# Patient Record
Sex: Male | Born: 1975
Health system: Southern US, Community
[De-identification: ages and names within clinical notes are randomized; demographics above are authoritative.]

## PROBLEM LIST (undated history)

## (undated) DIAGNOSIS — M1990 Unspecified osteoarthritis, unspecified site: Secondary | ICD-10-CM

## (undated) DIAGNOSIS — L559 Sunburn, unspecified: Secondary | ICD-10-CM

## (undated) DIAGNOSIS — F410 Panic disorder [episodic paroxysmal anxiety] without agoraphobia: Secondary | ICD-10-CM

## (undated) DIAGNOSIS — Z7289 Other problems related to lifestyle: Secondary | ICD-10-CM

## (undated) DIAGNOSIS — I10 Essential (primary) hypertension: Secondary | ICD-10-CM

## (undated) DIAGNOSIS — Z720 Tobacco use: Secondary | ICD-10-CM

## (undated) DIAGNOSIS — F1090 Alcohol use, unspecified, uncomplicated: Secondary | ICD-10-CM

## (undated) DIAGNOSIS — R519 Headache, unspecified: Secondary | ICD-10-CM

## (undated) DIAGNOSIS — M87051 Idiopathic aseptic necrosis of right femur: Secondary | ICD-10-CM

## (undated) DIAGNOSIS — R51 Headache: Secondary | ICD-10-CM

## (undated) DIAGNOSIS — K219 Gastro-esophageal reflux disease without esophagitis: Secondary | ICD-10-CM

## (undated) DIAGNOSIS — J449 Chronic obstructive pulmonary disease, unspecified: Secondary | ICD-10-CM

## (undated) HISTORY — PX: TONSILLECTOMY: SUR1361

## (undated) HISTORY — DX: Other problems related to lifestyle: Z72.89

## (undated) HISTORY — DX: Tobacco use: Z72.0

## (undated) HISTORY — DX: Alcohol use, unspecified, uncomplicated: F10.90

## (undated) HISTORY — PX: OTHER SURGICAL HISTORY: SHX169

---

## 2015-12-03 ENCOUNTER — Other Ambulatory Visit (HOSPITAL_COMMUNITY): Payer: Self-pay | Admitting: *Deleted

## 2015-12-03 NOTE — Patient Instructions (Addendum)
Gena FrayJody D Schellhase  12/03/2015   Your procedure is scheduled on: 12-17-15  [facility]  [facility] to 3rd floor to  Short Stay Center at 515  AM.  Call this number if you have problems the morning of surgery [phone]   Remember: ONLY 1 PERSON MAY GO WITH YOU TO SHORT STAY TO GET  READY MORNING OF YOUR SURGERY.  Do not eat food or drink liquids :After Midnight.     Take these medicines the morning of surgery with A SIP OF WATER: NONE                               You may not have any metal on your body including hair pins and              piercings  Do not wear jewelry, make-up, lotions, powders or perfumes, deodorant             Do not wear nail polish.  Do not shave  48 hours prior to surgery.              Men may shave face and neck.   Do not bring valuables to the hospital. [place] IS NOT             RESPONSIBLE   FOR VALUABLES.  Contacts, dentures or bridgework may not be worn into surgery.  Leave suitcase in the car. After surgery it may be brought to your room.   WIFE April DRIVER CELL [phone]               Please read over the following fact sheets you were given: _____________________________________________________________________             [facility] - Preparing for [facility] Before surgery, you can play an important role.  Because skin is not sterile, your skin needs to be as free of germs as possible.  You can reduce the number of germs on your skin by washing with CHG (chlorahexidine gluconate) soap before surgery.  CHG is an antiseptic cleaner which kills germs and bonds with the skin to continue killing germs even after washing. Please DO NOT use if you have an allergy to CHG or antibacterial soaps.  If your skin becomes reddened/irritated stop using the CHG and inform your nurse when you arrive at Short Stay. Do not shave (including legs and underarms) for at least 48 hours prior to the first CHG  shower.  You may shave your face/neck. Please follow these instructions carefully:  1.  Shower with CHG Soap the night before surgery and the  morning of Surgery.  2.  If you choose to wash your hair, wash your hair first as usual with your  normal  shampoo.  3.  After you shampoo, rinse your hair and body thoroughly to remove the  shampoo.                           4.  Use CHG as you would any other liquid soap.  You can apply chg directly  to the skin and wash                       Gently with a scrungie or clean washcloth.  5.  Apply the CHG Soap to your body ONLY FROM THE NECK DOWN.   Do not use on face/ open                           Wound or open sores. Avoid contact with eyes, ears mouth and genitals (private parts).                       Wash face,  Genitals (private parts) with your normal soap.             6.  Wash thoroughly, paying special attention to the area where your surgery  will be performed.  7.  Thoroughly rinse your body with warm water from the neck down.  8.  DO NOT shower/wash with your normal soap after using and rinsing off  the CHG Soap.                9.  Pat yourself dry with a clean towel.            10.  Wear clean pajamas.            11.  Place clean sheets on your bed the night of your first shower and do not  sleep with pets. Day of Surgery : Do not apply any lotions/deodorants the morning of surgery.  Please wear clean clothes to the hospital/surgery center.  FAILURE TO FOLLOW THESE INSTRUCTIONS MAY RESULT IN THE CANCELLATION OF YOUR SURGERY PATIENT SIGNATURE_________________________________  NURSE SIGNATURE__________________________________  ________________________________________________________________________   Adam Phenix  An incentive spirometer is a tool that can help keep your lungs clear and active. This tool measures how well you are filling your lungs with each breath. Taking long deep breaths may help reverse or decrease the chance  of developing breathing (pulmonary) problems (especially infection) following:  A long period of time when you are unable to move or be active. BEFORE THE PROCEDURE   If the spirometer includes an indicator to show your best effort, your nurse or respiratory therapist will set it to a desired goal.  If possible, sit up straight or lean slightly forward. Try not to slouch.  Hold the incentive spirometer in an upright position. INSTRUCTIONS FOR USE   Sit on the edge of your bed if possible, or sit up as far as you can in bed or on a chair.  Hold the incentive spirometer in an upright position.  Breathe out normally.  Place the mouthpiece in your mouth and seal your lips tightly around it.  Breathe in slowly and as deeply as possible, raising the piston or the ball toward the top of the column.  Hold your breath for 3-5 seconds or for as long as possible. Allow the piston or ball to fall to the bottom of the column.  Remove the mouthpiece from your mouth and breathe out normally.  Rest for a few seconds and repeat Steps 1 through 7 at least 10 times every 1-2 hours when you are awake. Take your time and take a few normal breaths between deep breaths.  The spirometer may include an indicator to show your best effort. Use the indicator as a goal to work toward during each repetition.  After each set of 10 deep breaths, practice coughing to be sure your lungs are clear. If you have an incision (the cut made at the time of surgery), support your incision when coughing by placing a pillow or  rolled up towels firmly against it. Once you are able to get out of bed, walk around indoors and cough well. You may stop using the incentive spirometer when instructed by your caregiver.  RISKS AND COMPLICATIONS  Take your time so you do not get dizzy or light-headed.  If you are in pain, you may need to take or ask for pain medication before doing incentive spirometry. It is harder to take a deep  breath if you are having pain. AFTER USE  Rest and breathe slowly and easily.  It can be helpful to keep track of a log of your progress. Your caregiver can provide you with a simple table to help with this. If you are using the spirometer at home, follow these instructions: [place] IF:   You are having difficultly using the spirometer.  You have trouble using the spirometer as often as instructed.  Your pain medication is not giving enough relief while using the spirometer.  You develop fever of 100.5 F (38.1 C) or higher. SEEK IMMEDIATE MEDICAL CARE IF:   You cough up bloody sputum that had not been present before.  You develop fever of 102 F (38.9 C) or greater.  You develop worsening pain at or near the incision site. MAKE SURE YOU:   Understand these instructions.  Will watch your condition.  Will get help right away if you are not doing well or get worse. Document Released: 12/28/2006 Document Revised: 11/09/2011 Document Reviewed: 02/28/2007 Exit[employer] Patient Information 2014 Exit[employer], [place].   ________________________________________________________________________  WHAT IS A BLOOD TRANSFUSION? Blood Transfusion Information  A transfusion is the replacement of blood or some of its parts. Blood is made up of multiple cells which provide different functions.  Red blood cells carry oxygen and are used for blood loss replacement.  White blood cells fight against infection.  Platelets control bleeding.  Plasma helps clot blood.  Other blood products are available for specialized needs, such as hemophilia or other clotting disorders. BEFORE THE TRANSFUSION  Who gives blood for transfusions?   Healthy volunteers who are fully evaluated to make sure their blood is safe. This is blood bank blood. Transfusion therapy is the safest it has ever been in the practice of medicine. Before blood is taken from a donor, a complete history is taken to make sure  that person has no history of diseases nor engages in risky social behavior (examples are intravenous drug use or sexual activity with multiple partners). The donor's travel history is screened to minimize risk of transmitting infections, such as malaria. The donated blood is tested for signs of infectious diseases, such as HIV and hepatitis. The blood is then tested to be sure it is compatible with you in order to minimize the chance of a transfusion reaction. If you or a relative donates blood, this is often done in anticipation of surgery and is not appropriate for emergency situations. It takes many days to process the donated blood. RISKS AND COMPLICATIONS Although transfusion therapy is very safe and saves many lives, the main dangers of transfusion include:   Getting an infectious disease.  Developing a transfusion reaction. This is an allergic reaction to something in the blood you were given. Every precaution is taken to prevent this. The decision to have a blood transfusion has been considered carefully by your caregiver before blood is given. Blood is not given unless the benefits outweigh the risks. AFTER THE TRANSFUSION  Right after receiving a blood transfusion, you will usually  feel much better and more energetic. This is especially true if your red blood cells have gotten low (anemic). The transfusion raises the level of the red blood cells which carry oxygen, and this usually causes an energy increase.  The nurse administering the transfusion will monitor you carefully for complications. HOME CARE INSTRUCTIONS  No special instructions are needed after a transfusion. You may find your energy is better. Speak with your caregiver about any limitations on activity for underlying diseases you may have. SEEK MEDICAL CARE IF:   Your condition is not improving after your transfusion.  You develop redness or irritation at the intravenous (IV) site. SEEK IMMEDIATE MEDICAL CARE IF:  Any of  the following symptoms occur over the next 12 hours:  Shaking chills.  You have a temperature by mouth above 102 F (38.9 C), not controlled by medicine.  Chest, back, or muscle pain.  People around you feel you are not acting correctly or are confused.  Shortness of breath or difficulty breathing.  Dizziness and fainting.  You get a rash or develop hives.  You have a decrease in urine output.  Your urine turns a dark color or changes to pink, red, or brown. Any of the following symptoms occur over the next 10 days:  You have a temperature by mouth above 102 F (38.9 C), not controlled by medicine.  Shortness of breath.  Weakness after normal activity.  The white part of the eye turns yellow (jaundice).  You have a decrease in the amount of urine or are urinating less often.  Your urine turns a dark color or changes to pink, red, or brown. Document Released: 08/14/2000 Document Revised: 11/09/2011 Document Reviewed: 04/02/2008 [facility] Patient Information 2014 [place], [place].  _______________________________________________________________________

## 2015-12-05 ENCOUNTER — Encounter (HOSPITAL_COMMUNITY)
Admission: RE | Admit: 2015-12-05 | Discharge: 2015-12-05 | Disposition: A | Discharge status: Home / Self Care | Payer: Medicaid Other | Source: Ambulatory Visit | Attending: Orthopedic Surgery | Admitting: Orthopedic Surgery

## 2015-12-05 ENCOUNTER — Encounter (HOSPITAL_COMMUNITY): Payer: Self-pay

## 2015-12-05 DIAGNOSIS — Z01818 Encounter for other preprocedural examination: Secondary | ICD-10-CM | POA: Diagnosis not present

## 2015-12-05 DIAGNOSIS — Z01812 Encounter for preprocedural laboratory examination: Secondary | ICD-10-CM | POA: Insufficient documentation

## 2015-12-05 DIAGNOSIS — M879 Osteonecrosis, unspecified: Secondary | ICD-10-CM | POA: Insufficient documentation

## 2015-12-05 HISTORY — DX: Chronic obstructive pulmonary disease, unspecified: J44.9

## 2015-12-05 HISTORY — DX: Gastro-esophageal reflux disease without esophagitis: K21.9

## 2015-12-05 HISTORY — DX: Essential (primary) hypertension: I10

## 2015-12-05 HISTORY — DX: Panic disorder (episodic paroxysmal anxiety): F41.0

## 2015-12-05 HISTORY — DX: Idiopathic aseptic necrosis of right femur: M87.051

## 2015-12-05 LAB — COMPREHENSIVE METABOLIC PANEL
ALT: 54 U/L (ref 17–63)
ANION GAP: 12 (ref 5–15)
AST: 98 U/L — ABNORMAL HIGH (ref 15–41)
Albumin: 4.1 g/dL (ref 3.5–5.0)
Alkaline Phosphatase: 151 U/L — ABNORMAL HIGH (ref 38–126)
BILIRUBIN TOTAL: 1.2 mg/dL (ref 0.3–1.2)
BUN: 9 mg/dL (ref 6–20)
CO2: 27 mmol/L (ref 22–32)
Calcium: 9.7 mg/dL (ref 8.9–10.3)
Chloride: 100 mmol/L — ABNORMAL LOW (ref 101–111)
Creatinine, Ser: 0.79 mg/dL (ref 0.61–1.24)
Glucose, Bld: 96 mg/dL (ref 65–99)
POTASSIUM: 4.4 mmol/L (ref 3.5–5.1)
Sodium: 139 mmol/L (ref 135–145)
TOTAL PROTEIN: 7.9 g/dL (ref 6.5–8.1)

## 2015-12-05 LAB — URINE MICROSCOPIC-ADD ON

## 2015-12-05 LAB — CBC
HEMATOCRIT: 43.9 % (ref 39.0–52.0)
Hemoglobin: 16 g/dL (ref 13.0–17.0)
MCH: 37.6 pg — AB (ref 26.0–34.0)
MCHC: 36.4 g/dL — AB (ref 30.0–36.0)
MCV: 103.3 fL — AB (ref 78.0–100.0)
PLATELETS: 244 10*3/uL (ref 150–400)
RBC: 4.25 MIL/uL (ref 4.22–5.81)
RDW: 13.7 % (ref 11.5–15.5)
WBC: 8.9 10*3/uL (ref 4.0–10.5)

## 2015-12-05 LAB — URINALYSIS, ROUTINE W REFLEX MICROSCOPIC
Glucose, UA: NEGATIVE mg/dL
HGB URINE DIPSTICK: NEGATIVE
Ketones, ur: 15 mg/dL — AB
NITRITE: NEGATIVE
PROTEIN: NEGATIVE mg/dL
Specific Gravity, Urine: 1.024 (ref 1.005–1.030)
pH: 6 (ref 5.0–8.0)

## 2015-12-05 LAB — APTT: aPTT: 35 seconds (ref 24–37)

## 2015-12-05 LAB — PROTIME-INR
INR: 1.07 (ref 0.00–1.49)
Prothrombin Time: 13.7 seconds (ref 11.6–15.2)

## 2015-12-05 LAB — SURGICAL PCR SCREEN
MRSA, PCR: NEGATIVE
STAPHYLOCOCCUS AUREUS: NEGATIVE

## 2015-12-05 NOTE — Progress Notes (Signed)
Pt had abnormal ekg from [facility] 08-07-15, stress test [facility] 08-09-15 on chart normal repeated ekg with pre op today. Chest xray 08-07-15 epic ekg 07-02-15 [facility] on chart

## 2015-12-15 NOTE — H&P (Addendum)
TOTAL HIP ADMISSION H&P  Patient is admitted for left total hip arthroplasty, anterior approach.  Subjective:  Chief Complaint: Light hip AVN (secondary to alcoholism) / pain  HPI: Timothy Foley, 40 y.o. male, has Foley history of pain and functional disability in the bilaterally hip(s) due to arthritis and AVN and patient has failed non-surgical conservative treatments for greater than 12 weeks to include NSAID's and/or analgesics, corticosteriod injections, use of assistive devices and activity modification.  Onset of symptoms was abrupt starting ~1 years ago with rapidlly worsening course since that time.The patient noted no past surgery on the left hip(s).  Patient currently rates pain in the left hip at 10 out of 10 with activity. Patient has night pain, worsening of pain with activity and weight bearing, trendelenberg gait, pain that interfers with activities of daily living and pain with passive range of motion. Patient has evidence of periarticular osteophytes, joint space narrowing and AVN by imaging studies. This condition presents safety issues increasing the risk of falls.  There is no current active infection.   Risks, benefits and expectations were discussed with the patient.  Risks including but not limited to the risk of anesthesia, blood clots, nerve damage, blood vessel damage, failure of the prosthesis, infection and up to and including death.  Patient understand the risks, benefits and expectations and wishes to proceed with surgery.   PCP: Timothy Foley,Timothy A, MD  D/C Plans:      Home  Post-op Meds:       No Rx given  Tranexamic Acid:      To be given - IV  Decadron:      Is to be given  FYI:     ASA  Norco  Nicotine Patch 14 mg  Spiritus Fermenti    Past Medical History  Diagnosis Date  . Hypertension   . Panic attacks     OCCASIONAL  . COPD (chronic obstructive pulmonary disease) (HCC)   . GERD (gastroesophageal reflux disease)   . Avascular necrosis of bone of  right hip [facility](HCC)     Past Surgical History  Procedure Laterality Date  . Pins to left wrist    . Tonsillectomy      No prescriptions prior to admission   Allergies  Allergen Reactions  . Erythromycin     "CHILDHOOD REACTION "    Social History  Substance Use Topics  . Smoking status: Current Every Day Smoker -- 1.00 packs/day for 25 years    Types: Cigarettes  . Smokeless tobacco: Never Used  . Alcohol Use: Yes     Comment: 1 FIFITH OF LIQUOR PER DAY       Review of Systems  Constitutional: Negative.   HENT: Negative.   Eyes: Negative.   Respiratory: Negative.   Cardiovascular: Negative.   Gastrointestinal: Positive for heartburn.  Genitourinary: Negative.   Musculoskeletal: Positive for joint pain.  Skin: Negative.   Neurological: Negative.   Endo/Heme/Allergies: Negative.   Psychiatric/Behavioral: The patient is nervous/anxious.     Objective:  Physical Exam  Constitutional: He is oriented to person, place, and time. He appears well-developed.  HENT:  Head: Normocephalic.  Eyes: Pupils are equal, round, and reactive to light.  Neck: Neck supple. No JVD present. No tracheal deviation present. No thyromegaly present.  Cardiovascular: Normal rate, regular rhythm, normal heart sounds and intact distal pulses.   Respiratory: Effort normal and breath sounds normal. No stridor. No respiratory distress. He has no wheezes.  GI: Soft. There is no tenderness.  There is no guarding.  Musculoskeletal:       Left hip: He exhibits decreased range of motion, decreased strength, tenderness and bony tenderness. He exhibits no swelling, no deformity and no laceration.  Lymphadenopathy:    He has no cervical adenopathy.  Neurological: He is alert and oriented to person, place, and time. Foley sensory deficit (bilateral LEs) is present.  Skin: Skin is warm and dry.  Psychiatric: He has Foley normal mood and affect.      Imaging Review Plain radiographs demonstrate severe degenerative  joint disease / AVN of the left hip(s). The bone quality appears to be good for age and reported activity level.  Assessment/Plan:  AVN, left hip  The patient history, physical examination, clinical judgement of the provider and imaging studies are consistent with AVN of the left hip(s) and total hip arthroplasty is deemed medically necessary. The treatment options including medical management, injection therapy, arthroscopy and arthroplasty were discussed at length. The risks and benefits of total hip arthroplasty were presented and reviewed. The risks due to aseptic loosening, infection, stiffness, dislocation/subluxation,  thromboembolic complications and other imponderables were discussed.  The patient acknowledged the explanation, agreed to proceed with the plan and consent was signed. Patient is being admitted for inpatient treatment for surgery, pain control, PT, OT, prophylactic antibiotics, VTE prophylaxis, progressive ambulation and ADL's and discharge planning.The patient is planning to be discharged home.     Timothy Auerbach Breezy Hertenstein   PA-C  12/15/2015, 1:06 PM

## 2015-12-17 ENCOUNTER — Ambulatory Visit (HOSPITAL_COMMUNITY): Payer: Medicaid Other | Admitting: Certified Registered"

## 2015-12-17 ENCOUNTER — Ambulatory Visit (HOSPITAL_COMMUNITY): Payer: Medicaid Other

## 2015-12-17 ENCOUNTER — Ambulatory Visit (HOSPITAL_COMMUNITY)
Admission: RE | Admit: 2015-12-17 | Discharge: 2015-12-17 | Disposition: A | Discharge status: Home / Self Care | Payer: Medicaid Other | Source: Ambulatory Visit | Attending: Orthopedic Surgery | Admitting: Orthopedic Surgery

## 2015-12-17 ENCOUNTER — Encounter (HOSPITAL_COMMUNITY)
Admission: RE | Disposition: A | Discharge status: Home / Self Care | Payer: Self-pay | Source: Ambulatory Visit | Attending: Orthopedic Surgery

## 2015-12-17 ENCOUNTER — Encounter (HOSPITAL_COMMUNITY): Payer: Self-pay | Admitting: *Deleted

## 2015-12-17 DIAGNOSIS — M160 Bilateral primary osteoarthritis of hip: Secondary | ICD-10-CM | POA: Insufficient documentation

## 2015-12-17 DIAGNOSIS — I10 Essential (primary) hypertension: Secondary | ICD-10-CM | POA: Diagnosis not present

## 2015-12-17 DIAGNOSIS — F17210 Nicotine dependence, cigarettes, uncomplicated: Secondary | ICD-10-CM | POA: Insufficient documentation

## 2015-12-17 DIAGNOSIS — K219 Gastro-esophageal reflux disease without esophagitis: Secondary | ICD-10-CM | POA: Insufficient documentation

## 2015-12-17 DIAGNOSIS — M879 Osteonecrosis, unspecified: Secondary | ICD-10-CM | POA: Diagnosis not present

## 2015-12-17 DIAGNOSIS — J449 Chronic obstructive pulmonary disease, unspecified: Secondary | ICD-10-CM | POA: Diagnosis not present

## 2015-12-17 DIAGNOSIS — Z96649 Presence of unspecified artificial hip joint: Secondary | ICD-10-CM

## 2015-12-17 HISTORY — PX: TOTAL HIP ARTHROPLASTY: SHX124

## 2015-12-17 LAB — TYPE AND SCREEN
ABO/RH(D): A POS
ANTIBODY SCREEN: NEGATIVE

## 2015-12-17 LAB — ABO/RH: ABO/RH(D): A POS

## 2015-12-17 SURGERY — ARTHROPLASTY, HIP, TOTAL, ANTERIOR APPROACH
Anesthesia: Spinal | Site: Hip | Laterality: Left

## 2015-12-17 MED ORDER — LIDOCAINE HCL (CARDIAC) 20 MG/ML IV SOLN
INTRAVENOUS | Status: DC | PRN
  Administered 2015-12-17: 80 mg via INTRAVENOUS

## 2015-12-17 MED ORDER — METHOCARBAMOL 1000 MG/10ML IJ SOLN
500.0000 mg | Freq: Four times a day (QID) | INTRAVENOUS | Status: DC | PRN
  Administered 2015-12-17: 500 mg via INTRAVENOUS
  Filled 2015-12-17 (×2): qty 5

## 2015-12-17 MED ORDER — LACTATED RINGERS IV SOLN
INTRAVENOUS | Status: DC | PRN
  Administered 2015-12-17 (×3): via INTRAVENOUS

## 2015-12-17 MED ORDER — PHENYLEPHRINE 40 MCG/ML (10ML) SYRINGE FOR IV PUSH (FOR BLOOD PRESSURE SUPPORT)
PREFILLED_SYRINGE | INTRAVENOUS | Status: AC
  Filled 2015-12-17: qty 10

## 2015-12-17 MED ORDER — PROPOFOL 10 MG/ML IV BOLUS
INTRAVENOUS | Status: AC
  Filled 2015-12-17: qty 40

## 2015-12-17 MED ORDER — MIDAZOLAM HCL 5 MG/5ML IJ SOLN
INTRAMUSCULAR | Status: DC | PRN
  Administered 2015-12-17 (×2): 2 mg via INTRAVENOUS

## 2015-12-17 MED ORDER — OXYCODONE HCL 5 MG PO TABS: 5.0000 mg | ORAL_TABLET | ORAL | Status: DC | PRN

## 2015-12-17 MED ORDER — AMLODIPINE BESYLATE 5 MG PO TABS
5.0000 mg | ORAL_TABLET | Freq: Once | ORAL | Status: AC
  Administered 2015-12-17: 5 mg via ORAL
  Filled 2015-12-17: qty 1

## 2015-12-17 MED ORDER — ASPIRIN EC 325 MG PO TBEC: 325.0000 mg | DELAYED_RELEASE_TABLET | Freq: Two times a day (BID) | ORAL | Status: AC

## 2015-12-17 MED ORDER — CEFAZOLIN SODIUM-DEXTROSE 2-4 GM/100ML-% IV SOLN
INTRAVENOUS | Status: AC
  Filled 2015-12-17: qty 100

## 2015-12-17 MED ORDER — MIDAZOLAM HCL 2 MG/2ML IJ SOLN
INTRAMUSCULAR | Status: AC
  Filled 2015-12-17: qty 2

## 2015-12-17 MED ORDER — DEXAMETHASONE SODIUM PHOSPHATE 10 MG/ML IJ SOLN
INTRAMUSCULAR | Status: AC
  Filled 2015-12-17: qty 1

## 2015-12-17 MED ORDER — PROCHLORPERAZINE EDISYLATE 5 MG/ML IJ SOLN
10.0000 mg | Freq: Once | INTRAMUSCULAR | Status: DC
  Filled 2015-12-17: qty 2

## 2015-12-17 MED ORDER — ONDANSETRON HCL 4 MG/2ML IJ SOLN
INTRAMUSCULAR | Status: AC
  Filled 2015-12-17: qty 2

## 2015-12-17 MED ORDER — METHOCARBAMOL 500 MG PO TABS: 500.0000 mg | ORAL_TABLET | Freq: Four times a day (QID) | ORAL | Status: DC | PRN

## 2015-12-17 MED ORDER — PROPOFOL 10 MG/ML IV BOLUS
INTRAVENOUS | Status: AC
Start: 2015-12-17 — End: 2015-12-17
  Filled 2015-12-17: qty 40

## 2015-12-17 MED ORDER — HYDROMORPHONE HCL 1 MG/ML IJ SOLN
0.5000 mg | INTRAMUSCULAR | Status: DC | PRN
  Administered 2015-12-17: 1 mg via INTRAVENOUS
  Filled 2015-12-17: qty 1

## 2015-12-17 MED ORDER — TRANEXAMIC ACID 1000 MG/10ML IV SOLN
1000.0000 mg | Freq: Once | INTRAVENOUS | Status: AC
  Administered 2015-12-17: 1000 mg via INTRAVENOUS
  Filled 2015-12-17: qty 10

## 2015-12-17 MED ORDER — BUPIVACAINE IN DEXTROSE 0.75-8.25 % IT SOLN
INTRATHECAL | Status: DC | PRN
  Administered 2015-12-17: 2 mL via INTRATHECAL

## 2015-12-17 MED ORDER — ONDANSETRON HCL 4 MG/2ML IJ SOLN
INTRAMUSCULAR | Status: DC | PRN
  Administered 2015-12-17 (×2): 2 mg via INTRAVENOUS

## 2015-12-17 MED ORDER — PROPOFOL 500 MG/50ML IV EMUL
INTRAVENOUS | Status: DC | PRN
  Administered 2015-12-17: 200 ug/kg/min via INTRAVENOUS

## 2015-12-17 MED ORDER — SODIUM CHLORIDE 0.9 % IR SOLN
Status: DC | PRN
  Administered 2015-12-17: 1000 mL

## 2015-12-17 MED ORDER — HYDROMORPHONE HCL 1 MG/ML IJ SOLN: 0.2500 mg | INTRAMUSCULAR | Status: DC | PRN

## 2015-12-17 MED ORDER — FENTANYL CITRATE (PF) 100 MCG/2ML IJ SOLN
INTRAMUSCULAR | Status: AC
  Filled 2015-12-17: qty 2

## 2015-12-17 MED ORDER — PROPOFOL 10 MG/ML IV BOLUS
INTRAVENOUS | Status: AC
  Filled 2015-12-17: qty 60

## 2015-12-17 MED ORDER — LIDOCAINE HCL (CARDIAC) 20 MG/ML IV SOLN
INTRAVENOUS | Status: AC
  Filled 2015-12-17: qty 5

## 2015-12-17 MED ORDER — OXYCODONE HCL 5 MG PO TABS
5.0000 mg | ORAL_TABLET | ORAL | Status: DC
  Administered 2015-12-17: 15 mg via ORAL
  Administered 2015-12-17: 10 mg via ORAL
  Administered 2015-12-17: 5 mg via ORAL
  Filled 2015-12-17: qty 3
  Filled 2015-12-17: qty 2
  Filled 2015-12-17: qty 1

## 2015-12-17 MED ORDER — DEXAMETHASONE SODIUM PHOSPHATE 10 MG/ML IJ SOLN
10.0000 mg | Freq: Once | INTRAMUSCULAR | Status: AC
  Administered 2015-12-17: 10 mg via INTRAVENOUS

## 2015-12-17 MED ORDER — PHENYLEPHRINE HCL 10 MG/ML IJ SOLN
INTRAMUSCULAR | Status: DC | PRN
  Administered 2015-12-17: 80 ug via INTRAVENOUS

## 2015-12-17 MED ORDER — HYDROMORPHONE HCL 2 MG PO TABS: 2.0000 mg | ORAL_TABLET | Freq: Four times a day (QID) | ORAL | Status: DC | PRN

## 2015-12-17 MED ORDER — FENTANYL CITRATE (PF) 100 MCG/2ML IJ SOLN
INTRAMUSCULAR | Status: DC | PRN
  Administered 2015-12-17 (×2): 50 ug via INTRAVENOUS

## 2015-12-17 MED ORDER — PROPOFOL 10 MG/ML IV BOLUS
INTRAVENOUS | Status: DC | PRN
  Administered 2015-12-17 (×3): 40 mg via INTRAVENOUS
  Administered 2015-12-17: 50 mg via INTRAVENOUS

## 2015-12-17 MED ORDER — CEFAZOLIN SODIUM-DEXTROSE 2-4 GM/100ML-% IV SOLN
2.0000 g | INTRAVENOUS | Status: AC
  Administered 2015-12-17: 2 mg via INTRAVENOUS

## 2015-12-17 MED ORDER — CEPHALEXIN 500 MG PO CAPS: 500.0000 mg | ORAL_CAPSULE | Freq: Three times a day (TID) | ORAL | Status: DC

## 2015-12-17 SURGICAL SUPPLY — 35 items
BAG DECANTER FOR FLEXI CONT (MISCELLANEOUS) IMPLANT
BAG ZIPLOCK 12X15 (MISCELLANEOUS) IMPLANT
CAPT HIP TOTAL 2 ×3 IMPLANT
CATH ROBINSON RED A/P 16FR (CATHETERS) ×3 IMPLANT
CLOTH BEACON ORANGE TIMEOUT ST (SAFETY) ×3 IMPLANT
COVER PERINEAL POST (MISCELLANEOUS) ×3 IMPLANT
DRAPE STERI IOBAN 125X83 (DRAPES) ×3 IMPLANT
DRAPE U-SHAPE 47X51 STRL (DRAPES) ×6 IMPLANT
DRSG AQUACEL AG ADV 3.5X10 (GAUZE/BANDAGES/DRESSINGS) ×3 IMPLANT
DURAPREP 26ML APPLICATOR (WOUND CARE) ×3 IMPLANT
ELECT REM PT RETURN 15FT ADLT (MISCELLANEOUS) IMPLANT
ELECT REM PT RETURN 9FT ADLT (ELECTROSURGICAL) ×3
ELECTRODE REM PT RTRN 9FT ADLT (ELECTROSURGICAL) ×1 IMPLANT
GLOVE BIOGEL M 7.0 STRL (GLOVE) IMPLANT
GLOVE BIOGEL PI IND STRL 7.5 (GLOVE) ×4 IMPLANT
GLOVE BIOGEL PI IND STRL 8.5 (GLOVE) ×1 IMPLANT
GLOVE BIOGEL PI INDICATOR 7.5 (GLOVE) ×8
GLOVE BIOGEL PI INDICATOR 8.5 (GLOVE) ×2
GLOVE ECLIPSE 8.0 STRL XLNG CF (GLOVE) ×6 IMPLANT
GLOVE ORTHO TXT STRL SZ7.5 (GLOVE) ×6 IMPLANT
GOWN STRL REUS W/TWL LRG LVL3 (GOWN DISPOSABLE) ×9 IMPLANT
GOWN STRL REUS W/TWL XL LVL3 (GOWN DISPOSABLE) ×6 IMPLANT
HOLDER FOLEY CATH W/STRAP (MISCELLANEOUS) IMPLANT
LIQUID BAND (GAUZE/BANDAGES/DRESSINGS) ×3 IMPLANT
PACK ANTERIOR HIP CUSTOM (KITS) ×3 IMPLANT
SAW OSC TIP CART 19.5X105X1.3 (SAW) ×3 IMPLANT
SHIELD SPLASH 9X12 PIC/PGM (MISCELLANEOUS) ×3 IMPLANT
SUT MNCRL AB 4-0 PS2 18 (SUTURE) ×3 IMPLANT
SUT VIC AB 1 CT1 36 (SUTURE) ×9 IMPLANT
SUT VIC AB 2-0 CT1 27 (SUTURE) ×4
SUT VIC AB 2-0 CT1 TAPERPNT 27 (SUTURE) ×2 IMPLANT
SUT VLOC 180 0 24IN GS25 (SUTURE) ×3 IMPLANT
SWABSTICK PVP IODINE (MISCELLANEOUS) ×3 IMPLANT
WATER STERILE IRR 1500ML POUR (IV SOLUTION) ×3 IMPLANT
YANKAUER SUCT BULB TIP 10FT TU (MISCELLANEOUS) IMPLANT

## 2015-12-17 NOTE — Discharge Instructions (Signed)
° °Spinal Anesthesia and Epidural Anesthesia, Care After °Refer to this sheet in the next few weeks. These instructions provide you with information about caring for yourself after your procedure. Your health care provider may also give you more specific instructions. Your treatment has been planned according to current medical practices, but problems sometimes occur. Call your health care provider if you have any problems or questions after your procedure. °WHAT TO EXPECT AFTER THE PROCEDURE °After your procedure, it is typical to have the following: °· Sleepiness. °· Nausea and vomiting. °HOME CARE INSTRUCTIONS °· For the first 24 hours after anesthetic medicine: °¨ Do not drive or operate heavy machinery. °¨ Do not drink alcohol. °¨ Do not make important decisions. °· Have someone stay with you for at least 24-48 hours. °· Drink enough fluid to keep your urine clear or pale yellow. °SEEK MEDICAL CARE IF: °· You have nausea and vomiting that continue the day after anesthetic medicine was given. °· You develop a rash. °SEEK IMMEDIATE MEDICAL CARE IF:  °· You have a fever. °· You have a persistent or severe headache. °· You develop blurred or double vision. °· You develop dizziness or lightheadedness. °· You faint. °· You have weakness, numbness, or tingling in your arms or legs. °· You have difficulty breathing. °· You are unable to pass urine. °  °This information is not intended to replace advice given to you by your health care provider. Make sure you discuss any questions you have with your health care provider. °  °Document Released: 11/07/2003 Document Revised: 09/07/2014 Document Reviewed: 03/28/2014 °Elsevier Interactive Patient Education ©2016 [employer] °INSTRUCTIONS AFTER JOINT REPLACEMENT  ° °o Remove items at home which could result in a fall. This includes throw rugs or furniture in walking pathways °o ICE to the affected joint every three hours while awake for 30 minutes at a time, for at least  the first 3-5 days, and then as needed for pain and swelling.  Continue to use ice for pain and swelling. You may notice swelling that will progress down to the foot and ankle.  This is normal after surgery.  Elevate your leg when you are not up walking on it.   °o Continue to use the breathing machine you got in the hospital (incentive spirometer) which will help keep your temperature down.  It is common for your temperature to cycle up and down following surgery, especially at night when you are not up moving around and exerting yourself.  The breathing machine keeps your lungs expanded and your temperature down. ° ° °DIET:  As you were doing prior to hospitalization, we recommend a well-balanced diet. ° °DRESSING / WOUND CARE / SHOWERING ° °Keep the surgical dressing until follow up.  The dressing is water proof, so you can shower without any extra covering.  IF THE DRESSING FALLS OFF or the wound gets wet inside, change the dressing with sterile gauze.  Please use good hand washing techniques before changing the dressing.  Do not use any lotions or creams on the incision until instructed by your surgeon.   ° °ACTIVITY ° °o Increase activity slowly as tolerated, but follow the weight bearing instructions below.   °o No driving for 6 weeks or until further direction given by your physician.  You cannot drive while taking narcotics.  °o No lifting or carrying greater than 10 lbs. until further directed by your surgeon. °o Avoid periods of inactivity such as sitting longer than an hour when not   asleep. This helps prevent blood clots.  °o You may return to work once you are authorized by your doctor.  ° ° ° °WEIGHT BEARING  ° °Weight bearing as tolerated with assist device (walker, cane, etc) as directed, use it as long as suggested by your surgeon or therapist, typically at least 4-6 weeks. ° ° °EXERCISES ° °Results after joint replacement surgery are often greatly improved when you follow the exercise, range of  motion and muscle strengthening exercises prescribed by your doctor. Safety measures are also important to protect the joint from further injury. Any time any of these exercises cause you to have increased pain or swelling, decrease what you are doing until you are comfortable again and then slowly increase them. If you have problems or questions, call your caregiver or physical therapist for advice.  ° °Rehabilitation is important following a joint replacement. After just a few days of immobilization, the muscles of the leg can become weakened and shrink (atrophy).  These exercises are designed to build up the tone and strength of the thigh and leg muscles and to improve motion. Often times heat used for twenty to thirty minutes before working out will loosen up your tissues and help with improving the range of motion but do not use heat for the first two weeks following surgery (sometimes heat can increase post-operative swelling).  ° °These exercises can be done on a training (exercise) mat, on the floor, on a table or on a bed. Use whatever works the best and is most comfortable for you.    Use music or television while you are exercising so that the exercises are a pleasant break in your day. This will make your life better with the exercises acting as a break in your routine that you can look forward to.   Perform all exercises about fifteen times, three times per day or as directed.  You should exercise both the operative leg and the other leg as well. ° °Exercises include: °  °• Quad Sets - Tighten up the muscle on the front of the thigh (Quad) and hold for 5-10 seconds.   °• Straight Leg Raises - With your knee straight (if you were given a brace, keep it on), lift the leg to 60 degrees, hold for 3 seconds, and slowly lower the leg.  Perform this exercise against resistance later as your leg gets stronger.  °• Leg Slides: Lying on your back, slowly slide your foot toward your buttocks, bending your knee up  off the floor (only go as far as is comfortable). Then slowly slide your foot back down until your leg is flat on the floor again.  °• Angel Wings: Lying on your back spread your legs to the side as far apart as you can without causing discomfort.  °• Hamstring Strength:  Lying on your back, push your heel against the floor with your leg straight by tightening up the muscles of your buttocks.  Repeat, but this time bend your knee to a comfortable angle, and push your heel against the floor.  You may put a pillow under the heel to make it more comfortable if necessary.  ° °A rehabilitation program following joint replacement surgery can speed recovery and prevent re-injury in the future due to weakened muscles. Contact your doctor or a physical therapist for more information on knee rehabilitation.  ° ° °CONSTIPATION ° °Constipation is defined medically as fewer than three stools per week and severe constipation as less than one   stool per week.  Even if you have a regular bowel pattern at home, your normal regimen is likely to be disrupted due to multiple reasons following surgery.  Combination of anesthesia, postoperative narcotics, change in appetite and fluid intake all can affect your bowels.  ° °YOU MUST use at least one of the following options; they are listed in order of increasing strength to get the job done.  They are all available over the counter, and you may need to use some, POSSIBLY even all of these options:   ° °Drink plenty of fluids (prune juice may be helpful) and high fiber foods °Colace 100 mg by mouth twice a day  °Senokot for constipation as directed and as needed Dulcolax (bisacodyl), take with full glass of water  °Miralax (polyethylene glycol) once or twice a day as needed. ° °If you have tried all these things and are unable to have a bowel movement in the first 3-4 days after surgery call either your surgeon or your primary doctor.   ° °If you experience loose stools or diarrhea, hold  the medications until you stool forms back up.  If your symptoms do not get better within 1 week or if they get worse, check with your doctor.  If you experience "the worst abdominal pain ever" or develop nausea or vomiting, please contact the office immediately for further recommendations for treatment. ° ° °ITCHING:  If you experience itching with your medications, try taking only a single pain pill, or even half a pain pill at a time.  You can also use Benadryl over the counter for itching or also to help with sleep.  ° °TED HOSE STOCKINGS:  Use stockings on both legs until for at least 2 weeks or as directed by physician office. They may be removed at night for sleeping. ° °MEDICATIONS:  See your medication summary on the “After Visit Summary” that nursing will review with you.  You may have some home medications which will be placed on hold until you complete the course of blood thinner medication.  It is important for you to complete the blood thinner medication as prescribed. ° °PRECAUTIONS:  If you experience chest pain or shortness of breath - call 911 immediately for transfer to the hospital emergency department.  ° °If you develop a fever greater that 101 F, purulent drainage from wound, increased redness or drainage from wound, foul odor from the wound/dressing, or calf pain - CONTACT YOUR SURGEON.   °                                                °FOLLOW-UP APPOINTMENTS:  If you do not already have a post-op appointment, please call the office for an appointment to be seen by your surgeon.  Guidelines for how soon to be seen are listed in your “After Visit Summary”, but are typically between 1-4 weeks after surgery. ° °OTHER INSTRUCTIONS:  ° °Knee Replacement:  Do not place pillow under knee, focus on keeping the knee straight while resting.  ° °MAKE SURE YOU:  °• Understand these instructions.  °• Get help right away if you are not doing well or get worse.  ° ° °Thank you for letting us be a part of  your medical care team.  It is a privilege we respect greatly.  We hope these instructions will help   you stay on track for a fast and full recovery!  °  °

## 2015-12-17 NOTE — Transfer of Care (Signed)
Immediate Anesthesia Transfer of Care Note  Patient: Timothy Foley  Procedure(s) Performed: Procedure(s): LEFT TOTAL HIP ARTHROPLASTY ANTERIOR APPROACH (Left)  Patient Location: PACU  Anesthesia Type:Spinal  Level of Consciousness:  sedated, patient cooperative and responds to stimulation  Airway & Oxygen Therapy:Patient Spontanous Breathing and Patient connected to face mask oxgen  Post-op Assessment:  Report given to PACU RN and Post -op Vital signs reviewed and stable  Post vital signs:  Reviewed and stable  Last Vitals:  Filed Vitals:   12/17/15 0530  BP: 148/95  Pulse: 106  Temp: 36.9 C  Resp: 18    Complications: No apparent anesthesia complications

## 2015-12-17 NOTE — Progress Notes (Signed)
   12/17/15 1646  PT Time Calculation  PT Start Time (ACUTE ONLY) 1533  PT Stop Time (ACUTE ONLY) 1553  PT Time Calculation (min) (ACUTE ONLY) 20 min  PT G-Codes **NOT FOR INPATIENT CLASS**  Functional Assessment Tool Used clinical judgement  Functional Limitation Mobility: Walking and moving around  Mobility: Walking and Moving Around Goal Status (N5621(G8979) CI  Mobility: Walking and Moving Around Discharge Status (H0865(G8980) CI  PT General Charges  $$ ACUTE PT VISIT 1 Procedure  PT Treatments  $Gait Training 8-22 mins   Rebeca AlertJannie Romelle Reiley, MPT [phone]

## 2015-12-17 NOTE — Addendum Note (Signed)
Addendum  created 12/17/15 1209 by Sherrian DiversBruce Tanish Prien, MD   Modules edited: Anesthesia Attestations

## 2015-12-17 NOTE — Anesthesia Procedure Notes (Signed)
Spinal Patient location during procedure: OR Start time: 12/17/2015 7:53 AM End time: 12/17/2015 7:55 AM Reason for block: at surgeon's request Staffing Resident/CRNA: Freddie Breech Performed by: resident/CRNA  Preanesthetic Checklist Completed: patient identified, site marked, surgical consent, pre-op evaluation, timeout performed, IV checked, risks and benefits discussed, monitors and equipment checked and at surgeon's request Spinal Block Patient position: sitting Prep: ChloraPrep Patient monitoring: heart rate, continuous pulse ox and blood pressure Approach: midline Location: L3-4 Injection technique: single-shot Needle Needle type: Sprotte  Needle gauge: 24 G Needle length: 10 cm Assessment Sensory level: T6 Additional Notes Expiration date of kit checked and confirmed. Patient tolerated procedure well, without complications. X 1 attempt with noted clear CSF return. Loss of motor and sensory on exam post injection.

## 2015-12-17 NOTE — Evaluation (Signed)
Physical Therapy Evaluation Patient Details Name: Timothy Foley MRN: [id number] DOB: 10/25/1975 Today's Date: 12/17/2015   History of Present Illness   40 yo male s/p L THA-direct anterior 12/17/15.   Clinical Impression  On eval, pt required Min-Mod assist for mobility. Pt only able to stand and pivot to recliner at this time. Pt was very shaky. Unable to attempt ambulation at this time due to weakness. Will return later to continue to assess pt's ability to mobilize. Pt will need to be able to demonstrate ability to ambulate and climb at least 2 steps in order to safely d/c home on today.     Follow Up Recommendations Home health PT;Supervision/Assistance - 24 hour    Equipment Recommendations  None recommended by PT    Recommendations for Other Services       Precautions / Restrictions        Mobility  Bed Mobility Overal bed mobility: Needs Assistance Bed Mobility: Supine to Sit     Supine to sit: Min assist     General bed mobility comments: Assist to get to EOB. Increased time.   Transfers Overall transfer level: Needs assistance Equipment used: Rolling walker (2 wheeled) Transfers: Sit to/from UGI CorporationStand;Stand Pivot Transfers Sit to Stand: Mod assist Stand pivot transfers: Min assist       General transfer comment: assist to rise, stabilize, control descent. VCs safety, hand placement. Pt very shaky. stand pivot from bed to recliner with RW  Ambulation/Gait Ambulation/Gait assistance: Min assist Ambulation Distance (Feet): 3 Feet Assistive device: Rolling walker (2 wheeled) Gait Pattern/deviations: Step-to pattern;Antalgic     General Gait Details: Assist to stabilize and maneuver with walker. VCs safety, sequence, technique. Pt requesting to sit down due to weakness.   Stairs            Wheelchair Mobility    Modified Rankin (Stroke Patients Only)       Balance Overall balance assessment: Needs assistance         Standing balance support:  Bilateral upper extremity supported;During functional activity Standing balance-Leahy Scale: Poor                               Pertinent Vitals/Pain Pain Assessment: 0-10 Pain Score: 5  Pain Location: L LE Pain Descriptors / Indicators: Sore;Aching Pain Intervention(s): Premedicated before session;Repositioned    Home Living Family/patient expects to be discharged to:: Private residence Living Arrangements: Spouse/significant other Available Help at Discharge: Family Type of Home: House Home Access: Stairs to enter   Secretary/administratorntrance Stairs-Number of Steps: 2 Home Layout: Multi-level Home Equipment: Environmental consultantWalker - 2 wheels;Crutches      Prior Function Level of Independence: Independent               Hand Dominance        Extremity/Trunk Assessment   Upper Extremity Assessment: Overall WFL for tasks assessed           Lower Extremity Assessment: RLE deficits/detail;LLE deficits/detail      Cervical / Trunk Assessment: Normal  Communication   Communication: No difficulties  Cognition Arousal/Alertness: Awake/alert Behavior During Therapy: WFL for tasks assessed/performed Overall Cognitive Status: Within Functional Limits for tasks assessed                      General Comments      Exercises        Assessment/Plan    PT Assessment Patient needs continued  PT services  PT Diagnosis Difficulty walking;Acute pain   PT Problem List Decreased strength;Decreased range of motion;Decreased activity tolerance;Decreased balance;Decreased mobility;Pain;Decreased knowledge of use of DME  PT Treatment Interventions DME instruction;Gait training;Functional mobility training;Therapeutic activities;Patient/family education;Balance training;Therapeutic exercise   PT Goals (Current goals can be found in the Care Plan section) Acute Rehab PT Goals Patient Stated Goal: home PT Goal Formulation: With patient/family Time For Goal Achievement:  12/24/15 Potential to Achieve Goals: Good    Frequency 7X/week   Barriers to discharge        Co-evaluation               End of Session Equipment Utilized During Treatment: Gait belt Activity Tolerance: Patient limited by fatigue (limited by weakness) Patient left: in chair;with call bell/phone within reach;with family/visitor present      Functional Assessment Tool Used: clinical judgement Functional Limitation: Mobility: Walking and moving around Mobility: Walking and Moving Around Current Status [phone]): At least 20 percent but less than 40 percent impaired, limited or restricted Mobility: Walking and Moving Around Goal Status [phone]): At least 1 percent but less than 20 percent impaired, limited or restricted    Time: 0981-1914 PT Time Calculation (min) (ACUTE ONLY): 20 min   Charges:   PT Evaluation $PT Eval Low Complexity: 1 Procedure     PT G Codes:   PT G-Codes **NOT FOR INPATIENT CLASS** Functional Assessment Tool Used: clinical judgement Functional Limitation: Mobility: Walking and moving around Mobility: Walking and Moving Around Current Status (N8295): At least 20 percent but less than 40 percent impaired, limited or restricted Mobility: Walking and Moving Around Goal Status [phone]): At least 1 percent but less than 20 percent impaired, limited or restricted    Rebeca Alert, MPT Pager: [phone]

## 2015-12-17 NOTE — Anesthesia Postprocedure Evaluation (Signed)
Anesthesia Post Note  Patient: Timothy Foley  Procedure(s) Performed: Procedure(s) (LRB): LEFT TOTAL HIP ARTHROPLASTY ANTERIOR APPROACH (Left)  Patient location during evaluation: PACU Anesthesia Type: Spinal Level of consciousness: oriented and awake and alert Pain management: pain level controlled Vital Signs Assessment: post-procedure vital signs reviewed and stable Respiratory status: spontaneous breathing, respiratory function stable and patient connected to nasal cannula oxygen Cardiovascular status: blood pressure returned to baseline and stable Postop Assessment: no headache, no backache and spinal receding Anesthetic complications: no    Last Vitals:  Filed Vitals:   12/17/15 1015 12/17/15 1030  BP: 129/90 126/91  Pulse: 72 70  Temp: 36.4 C 36.5 C  Resp: 16 16    Last Pain:  Filed Vitals:   12/17/15 1142  PainSc: 10-Worst pain ever                 Rayssa Atha J

## 2015-12-17 NOTE — Interval H&P Note (Signed)
History and Physical Interval Note:  12/17/2015 7:43 AM  Timothy Foley  has presented today for surgery, with the diagnosis of LEFT HIP AVN SECONDARY TO ALCOHOL  The various methods of treatment have been discussed with the patient and family. After consideration of risks, benefits and other options for treatment, the patient has consented to  Procedure(s): LEFT TOTAL HIP ARTHROPLASTY ANTERIOR APPROACH (Left) as a surgical intervention .  The patient's history has been reviewed, patient examined, no change in status, stable for surgery.  I have reviewed the patient's chart and labs.  Questions were answered to the patient's satisfaction.     [name],Shunsuke Granzow D

## 2015-12-17 NOTE — Progress Notes (Signed)
Physical Therapy Treatment Patient Details Name: FILMORE MOLYNEUX MRN: [id number] DOB: 08/11/1976 Today's Date: 12/17/2015    History of Present Illness 40 yo male s/p L THA-DA 12/17/15.     PT Comments    Improved mobility and activity tolerance this session. Practiced gait training and stair training. Issues HEP.   Follow Up Recommendations  Home health PT;Supervision/Assistance - 24 hour     Equipment Recommendations  None recommended by PT    Recommendations for Other Services       Precautions / Restrictions Precautions Precautions: Fall Restrictions Weight Bearing Restrictions: No LLE Weight Bearing: Weight bearing as tolerated    Mobility  Bed Mobility Overal bed mobility: Needs Assistance           General bed mobility comments: OOB in recliner  Transfers Overall transfer level: Needs assistance Equipment used: Rolling walker (2 wheeled) Transfers: Sit to/from Stand Sit to Stand: Min guard        General transfer comment: close guard for safety. VCs safety, hand placement  Ambulation/Gait Ambulation/Gait assistance: Min guard Ambulation Distance (Feet): 75 Feet Assistive device: Rolling walker (2 wheeled);Crutches Gait Pattern/deviations: Step-to pattern     General Gait Details: 1/2 distance with RW, 1/2 distance with crutches. slow gait speed. Pt prefers to use crutches (has been using them prior to surgery).    Stairs Stairs: Yes Stairs assistance: Min guard Stair Management: Forwards;With crutches Number of Stairs: 2 General stair comments: VCs safety, technique, sequence. close guard for safety.  Wheelchair Mobility    Modified Rankin (Stroke Patients Only)       Balance Overall balance assessment: Needs assistance         Standing balance support: Bilateral upper extremity supported;During functional activity Standing balance-Leahy Scale: Poor                      Cognition Arousal/Alertness:  Awake/alert Behavior During Therapy: WFL for tasks assessed/performed Overall Cognitive Status: Within Functional Limits for tasks assessed                      Exercises Total Joint Exercises Marching in Standing: AROM;Left;5 reps;Standing;Limitations Marching in Standing Limitations: limited range    General Comments        Pertinent Vitals/Pain Pain Assessment: 0-10 Pain Score: 5  Pain Location: L LE Pain Descriptors / Indicators: Sore;Aching Pain Intervention(s): Repositioned;Monitored during session    Home Living Family/patient expects to be discharged to:: Private residence Living Arrangements: Spouse/significant other Available Help at Discharge: Family Type of Home: House Home Access: Stairs to enter   Home Layout: Multi-level Home Equipment: Environmental consultant - 2 wheels;Crutches      Prior Function Level of Independence: Independent          PT Goals (current goals can now be found in the care plan section) Acute Rehab PT Goals Patient Stated Goal: home PT Goal Formulation: With patient/family Time For Goal Achievement: 12/24/15 Potential to Achieve Goals: Good Progress towards PT goals: Progressing toward goals    Frequency  7X/week    PT Plan Current plan remains appropriate    Co-evaluation             End of Session Equipment Utilized During Treatment: Gait belt Activity Tolerance: Patient tolerated treatment well Patient left: with call bell/phone within reach;with family/visitor present;with nursing/sitter in room (in bathroom with nursing)     Time: 4098-1191 PT Time Calculation (min) (ACUTE ONLY): 20 min  Charges:  $Gait Training: 8-22  mins                    G Codes:  Functional Assessment Tool Used: clinical judgement Functional Limitation: Mobility: Walking and moving around Mobility: Walking and Moving Around Current Status [phone](G8978): At least 20 percent but less than 40 percent impaired, limited or restricted Mobility: Walking  and Moving Around Goal Status [phone](G8979): At least 1 percent but less than 20 percent impaired, limited or restricted   Rebeca AlertJannie Muranda Coye, MPT Pager: [phone]

## 2015-12-17 NOTE — Op Note (Signed)
NAME:  Timothy FrayJody D Pulse                ACCOUNT NO.: [id number]      MEDICAL RECORD NO.: [id number]      [facility]      PHYSICIAN:  Durene RomansLIN,Juventino Pavone D  DATE OF BIRTH:  [birth year]     DATE OF PROCEDURE:  12/17/2015                                 OPERATIVE REPORT         PREOPERATIVE DIAGNOSIS: Left  hip avascular necrosis.      POSTOPERATIVE DIAGNOSIS:  Left hip avascular necrosis.      PROCEDURE:  Left total hip replacement through an anterior approach   utilizing DePuy THR system, component size 54mm pinnacle cup, a size 36+4 neutral   Altrex liner, a size 9 Hi Tri Lock stem with a 36+5 delta ceramic   ball.      SURGEON:  Madlyn FrankelMatthew D. Charlann Boxerlin, M.D.      ASSISTANT:  Lanney GinsMatthew Babish, PA-C      ANESTHESIA:  Spinal.      SPECIMENS:  None.      COMPLICATIONS:  None.      BLOOD LOSS:  700 cc     DRAINS:  None.      INDICATION OF THE PROCEDURE:  Timothy Foley is a 40 y.o. male who had   presented to office for evaluation of left hip pain.  Radiographs revealed   progressive avascular necrosis with femoral head collpase.  The patient had painful limited range of   motion significantly affecting their overall quality of life.  The patient was failing to    respond to conservative measures, and at this point was ready   to proceed with more definitive measures.  The patient has noted progressive   degenerative changes in his hip, progressive problems and dysfunction   with regarding the hip prior to surgery.  Consent was obtained for   benefit of pain relief.  Specific risk of infection, DVT, component   failure, dislocation, need for revision surgery, as well discussion of   the anterior versus posterior approach were reviewed.  Consent was   obtained for benefit of anterior pain relief through an anterior   approach.      PROCEDURE IN DETAIL:  The patient was brought to operative theater.   Once adequate anesthesia, preoperative antibiotics, 2gm of  Ancef, 1 gm of Tranexamic Acid, and 10 mg of Decadron administered.   The patient was positioned supine on the OSI Hanna table.  Once adequate   padding of boney process was carried out, we had predraped out the hip, and  used fluoroscopy to confirm orientation of the pelvis and position.      The left hip was then prepped and draped from proximal iliac crest to   mid thigh with shower curtain technique.      Time-out was performed identifying the patient, planned procedure, and   extremity.     An incision was then made 2 cm distal and lateral to the   anterior superior iliac spine extending over the orientation of the   tensor fascia lata muscle and sharp dissection was carried down to the   fascia of the muscle and protractor placed in the soft tissues.      The fascia was then incised.  The muscle belly was identified and swept   laterally and retractor placed along the superior neck.  Following   cauterization of the circumflex vessels and removing some pericapsular   fat, a second cobra retractor was placed on the inferior neck.  A third   retractor was placed on the anterior acetabulum after elevating the   anterior rectus.  A L-capsulotomy was along the line of the   superior neck to the trochanteric fossa, then extended proximally and   distally.  Tag sutures were placed and the retractors were then placed   intracapsular.  We then identified the trochanteric fossa and   orientation of my neck cut, confirmed this radiographically   and then made a neck osteotomy with the femur on traction.  The femoral   head was removed without difficulty or complication.  Traction was let   off and retractors were placed posterior and anterior around the   acetabulum.      The labrum and foveal tissue were debrided.  I began reaming with a 45mm   reamer and reamed up to 53mm reamer with good bony bed preparation and a 54mm   cup was chosen.  The final 54mm Pinnacle cup was then impacted  under fluoroscopy  to confirm the depth of penetration and orientation with respect to   abduction.  A screw was placed followed by the hole eliminator.  The final   36+4 neutral Altrex liner was impacted with good visualized rim fit.  The cup was positioned anatomically within the acetabular portion of the pelvis.      At this point, the femur was rolled at 80 degrees.  Further capsule was   released off the inferior aspect of the femoral neck.  I then   released the superior capsule proximally.  The hook was placed laterally   along the femur and elevated manually and held in position with the bed   hook.  The leg was then extended and adducted with the leg rolled to 100   degrees of external rotation.  Once the proximal femur was fully   exposed, I used a box osteotome to set orientation.  I then began   broaching with the starting chili pepper broach and passed this by hand and then broached up to 9.  With the 9 broach in place I chose a high offset neck and did several trial reduction.  The offset was appropriate, leg lengths   appeared to be equal best matched with the +5 head ball trial, confirmed radiographically.   Given these findings, I went ahead and dislocated the hip, repositioned all   retractors and positioned the right hip in the extended and abducted position.  The final 9 Hi Tri Lock stem was   chosen and it was impacted down to the level of neck cut.  Based on this   and the trial reduction, a 36+5 delta ceramic ball was chosen and   impacted onto a clean and dry trunnion, and the hip was reduced.  The   hip had been irrigated throughout the case again at this point.  I did   reapproximate the superior capsular leaflet to the anterior leaflet   using #1 Vicryl.  The fascia of the   tensor fascia lata muscle was then reapproximated using #1 Vicryl and #0 V-lock sutures.  The   remaining wound was closed with 2-0 Vicryl and running 4-0 Monocryl.   The hip was cleaned, dried,  and dressed sterilely  using Dermabond and   Aquacel dressing.  He was then brought   to recovery room in stable condition tolerating the procedure well.    Lanney Gins, PA-C was present for the entirety of the case involved from   preoperative positioning, perioperative retractor management, general   facilitation of the case, as well as primary wound closure as assistant.            Madlyn Frankel Charlann Boxer, M.D.        12/17/2015 10:54 AM

## 2015-12-17 NOTE — Anesthesia Preprocedure Evaluation (Addendum)
Anesthesia Evaluation  Patient identified by MRN, date of birth, ID band Patient awake    Reviewed: Allergy & Precautions, NPO status , Patient's Chart, lab work & pertinent test results  Airway Mallampati: II  TM Distance: >3 FB Neck ROM: Full    Dental no notable dental hx.    Pulmonary COPD, Current Smoker,    Pulmonary exam normal breath sounds clear to auscultation       Cardiovascular Exercise Tolerance: Good hypertension, Pt. on medications Normal cardiovascular exam Rhythm:Regular Rate:Normal     Neuro/Psych negative neurological ROS  negative psych ROS   GI/Hepatic negative GI ROS, Neg liver ROS, GERD  Medicated,  Endo/Other  negative endocrine ROS  Renal/GU negative Renal ROS  negative genitourinary   Musculoskeletal negative musculoskeletal ROS (+)   Abdominal   Peds negative pediatric ROS (+)  Hematology negative hematology ROS (+)   Anesthesia Other Findings   Reproductive/Obstetrics negative OB ROS                             Anesthesia Physical Anesthesia Plan  ASA: II  Anesthesia Plan: Spinal   Post-op Pain Management:    Induction: Intravenous  Airway Management Planned:   Additional Equipment:   Intra-op Plan:   Post-operative Plan: Extubation in OR  Informed Consent: I have reviewed the patients History and Physical, chart, labs and discussed the procedure including the risks, benefits and alternatives for the proposed anesthesia with the patient or authorized representative who has indicated his/her understanding and acceptance.   Dental advisory given  Plan Discussed with: CRNA  Anesthesia Plan Comments: (Discussed risks and benefits of and differences between spinal and general. Discussed risks of spinal including headache, backache, failure, bleeding and hematoma, infection, and nerve damage. Patient consents to spinal. Questions answered.  Coagulation studies and platelet count acceptable.)       Anesthesia Quick Evaluation

## 2016-02-27 ENCOUNTER — Other Ambulatory Visit (HOSPITAL_COMMUNITY): Payer: Self-pay | Admitting: *Deleted

## 2016-02-27 NOTE — Patient Instructions (Signed)
Timothy Foley  02/27/2016   Your procedure is scheduled on: 03/10/16  [facility] 3rd floor to  Short Stay Center at 0500 AM.  Call this number if you have problems the morning of surgery [phone]   Remember: ONLY 1 PERSON MAY GO WITH YOU TO SHORT STAY TO GET  READY MORNING OF YOUR SURGERY.  Do not eat food or drink liquids :After Midnight.     Take these medicines the morning of surgery with A SIP OF WATER:  DO NOT TAKE ANY DIABETIC MEDICATIONS DAY OF YOUR SURGERY                               You may not have any metal on your body including hair pins and              piercings  Do not wear jewelry, make-up, lotions, powders or perfumes, deodorant             Do not wear nail polish.  Do not shave  48 hours prior to surgery.              Men may shave face and neck.   Do not bring valuables to the hospital. [facility] IS NOT             RESPONSIBLE   FOR VALUABLES.  Contacts, dentures or bridgework may not be worn into surgery.  Leave suitcase in the car. After surgery it may be brought to your room.     Patients discharged the day of surgery will not be allowed to drive home.  Name and phone number of your driver:  Special Instructions:              Please read over the following fact sheets you were given: _____________________________________________________________________             [facility] - Preparing for [facility] Before surgery, you can play an important role.  Because skin is not sterile, your skin needs to be as free of germs as possible.  You can reduce the number of germs on your skin by washing with CHG (chlorahexidine gluconate) soap before surgery.  CHG is an antiseptic cleaner which kills germs and bonds with the skin to continue killing germs even after washing. Please DO NOT use if you have an allergy to CHG or antibacterial soaps.  If your skin becomes reddened/irritated stop  using the CHG and inform your nurse when you arrive at Short Stay. Do not shave (including legs and underarms) for at least 48 hours prior to the first CHG shower.  You may shave your face/neck. Please follow these instructions carefully:  1.  Shower with CHG Soap the night before surgery and the  morning of Surgery.  2.  If you choose to wash your hair, wash your hair first as usual with your  normal  shampoo.  3.  After you shampoo, rinse your hair and body thoroughly to remove the  shampoo.                           4.  Use CHG as you would any other liquid soap.  You can apply chg directly  to the skin and wash  Gently with a scrungie or clean washcloth.  5.  Apply the CHG Soap to your body ONLY FROM THE NECK DOWN.   Do not use on face/ open                           Wound or open sores. Avoid contact with eyes, ears mouth and genitals (private parts).                       Wash face,  Genitals (private parts) with your normal soap.             6.  Wash thoroughly, paying special attention to the area where your surgery  will be performed.  7.  Thoroughly rinse your body with warm water from the neck down.  8.  DO NOT shower/wash with your normal soap after using and rinsing off  the CHG Soap.                9.  Pat yourself dry with a clean towel.            10.  Wear clean pajamas.            11.  Place clean sheets on your bed the night of your first shower and do not  sleep with pets. Day of Surgery : Do not apply any lotions/deodorants the morning of surgery.  Please wear clean clothes to the hospital/surgery center.  FAILURE TO FOLLOW THESE INSTRUCTIONS MAY RESULT IN THE CANCELLATION OF YOUR SURGERY PATIENT SIGNATURE_________________________________  NURSE SIGNATURE__________________________________  ________________________________________________________________________  WHAT IS A BLOOD TRANSFUSION? Blood Transfusion Information  A transfusion is the  replacement of blood or some of its parts. Blood is made up of multiple cells which provide different functions.  Red blood cells carry oxygen and are used for blood loss replacement.  White blood cells fight against infection.  Platelets control bleeding.  Plasma helps clot blood.  Other blood products are available for specialized needs, such as hemophilia or other clotting disorders. BEFORE THE TRANSFUSION  Who gives blood for transfusions?   Healthy volunteers who are fully evaluated to make sure their blood is safe. This is blood bank blood. Transfusion therapy is the safest it has ever been in the practice of medicine. Before blood is taken from a donor, a complete history is taken to make sure that person has no history of diseases nor engages in risky social behavior (examples are intravenous drug use or sexual activity with multiple partners). The donor's travel history is screened to minimize risk of transmitting infections, such as malaria. The donated blood is tested for signs of infectious diseases, such as HIV and hepatitis. The blood is then tested to be sure it is compatible with you in order to minimize the chance of a transfusion reaction. If you or a relative donates blood, this is often done in anticipation of surgery and is not appropriate for emergency situations. It takes many days to process the donated blood. RISKS AND COMPLICATIONS Although transfusion therapy is very safe and saves many lives, the main dangers of transfusion include:   Getting an infectious disease.  Developing a transfusion reaction. This is an allergic reaction to something in the blood you were given. Every precaution is taken to prevent this. The decision to have a blood transfusion has been considered carefully by your caregiver before blood is given. Blood is not given unless the benefits outweigh  the risks. AFTER THE TRANSFUSION  Right after receiving a blood transfusion, you will usually  feel much better and more energetic. This is especially true if your red blood cells have gotten low (anemic). The transfusion raises the level of the red blood cells which carry oxygen, and this usually causes an energy increase.  The nurse administering the transfusion will monitor you carefully for complications. HOME CARE INSTRUCTIONS  No special instructions are needed after a transfusion. You may find your energy is better. Speak with your caregiver about any limitations on activity for underlying diseases you may have. SEEK MEDICAL CARE IF:   Your condition is not improving after your transfusion.  You develop redness or irritation at the intravenous (IV) site. SEEK IMMEDIATE MEDICAL CARE IF:  Any of the following symptoms occur over the next 12 hours:  Shaking chills.  You have a temperature by mouth above 102 F (38.9 C), not controlled by medicine.  Chest, back, or muscle pain.  People around you feel you are not acting correctly or are confused.  Shortness of breath or difficulty breathing.  Dizziness and fainting.  You get a rash or develop hives.  You have a decrease in urine output.  Your urine turns a dark color or changes to pink, red, or brown. Any of the following symptoms occur over the next 10 days:  You have a temperature by mouth above 102 F (38.9 C), not controlled by medicine.  Shortness of breath.  Weakness after normal activity.  The white part of the eye turns yellow (jaundice).  You have a decrease in the amount of urine or are urinating less often.  Your urine turns a dark color or changes to pink, red, or brown. Document Released: 08/14/2000 Document Revised: 11/09/2011 Document Reviewed: 04/02/2008 ExitCare Patient Information 2014 [place].  _______________________________________________________________________  Incentive Spirometer  An incentive spirometer is a tool that can help keep your lungs clear and active. This tool  measures how well you are filling your lungs with each breath. Taking long deep breaths may help reverse or decrease the chance of developing breathing (pulmonary) problems (especially infection) following:  A long period of time when you are unable to move or be active. BEFORE THE PROCEDURE   If the spirometer includes an indicator to show your best effort, your nurse or respiratory therapist will set it to a desired goal.  If possible, sit up straight or lean slightly forward. Try not to slouch.  Hold the incentive spirometer in an upright position. INSTRUCTIONS FOR USE   Sit on the edge of your bed if possible, or sit up as far as you can in bed or on a chair.  Hold the incentive spirometer in an upright position.  Breathe out normally.  Place the mouthpiece in your mouth and seal your lips tightly around it.  Breathe in slowly and as deeply as possible, raising the piston or the ball toward the top of the column.  Hold your breath for 3-5 seconds or for as long as possible. Allow the piston or ball to fall to the bottom of the column.  Remove the mouthpiece from your mouth and breathe out normally.  Rest for a few seconds and repeat Steps 1 through 7 at least 10 times every 1-2 hours when you are awake. Take your time and take a few normal breaths between deep breaths.  The spirometer may include an indicator to show your best effort. Use the indicator as a goal to work  toward during each repetition.  After each set of 10 deep breaths, practice coughing to be sure your lungs are clear. If you have an incision (the cut made at the time of surgery), support your incision when coughing by placing a pillow or rolled up towels firmly against it. Once you are able to get out of bed, walk around indoors and cough well. You may stop using the incentive spirometer when instructed by your caregiver.  RISKS AND COMPLICATIONS  Take your time so you do not get dizzy or light-headed.  If  you are in pain, you may need to take or ask for pain medication before doing incentive spirometry. It is harder to take a deep breath if you are having pain. AFTER USE  Rest and breathe slowly and easily.  It can be helpful to keep track of a log of your progress. Your caregiver can provide you with a simple table to help with this. If you are using the spirometer at home, follow these instructions: SEEK MEDICAL CARE IF:   You are having difficultly using the spirometer.  You have trouble using the spirometer as often as instructed.  Your pain medication is not giving enough relief while using the spirometer.  You develop fever of 100.5 F (38.1 C) or higher. SEEK IMMEDIATE MEDICAL CARE IF:   You cough up bloody sputum that had not been present before.  You develop fever of 102 F (38.9 C) or greater.  You develop worsening pain at or near the incision site. MAKE SURE YOU:   Understand these instructions.  Will watch your condition.  Will get help right away if you are not doing well or get worse. Document Released: 12/28/2006 Document Revised: 11/09/2011 Document Reviewed: 02/28/2007 [facility] Patient Information 2014 [employer], [place].   ________________________________________________________________________  _______________________________________________________________________

## 2016-02-27 NOTE — Progress Notes (Signed)
ekg 4/17 epic 

## 2016-02-28 ENCOUNTER — Encounter (HOSPITAL_COMMUNITY): Admission: RE | Admit: 2016-02-28 | Payer: Medicaid Other | Source: Ambulatory Visit

## 2016-02-28 ENCOUNTER — Inpatient Hospital Stay (HOSPITAL_COMMUNITY)
Admission: RE | Admit: 2016-02-28 | Discharge: 2016-02-28 | Disposition: A | Discharge status: Home / Self Care | Payer: Medicaid Other | Source: Ambulatory Visit

## 2016-02-28 NOTE — Progress Notes (Signed)
No show for PST appt-  Reached wife who stated he woke up dizzy and swimmy headed. Asked her to have him call to reschedule. Spoke with Augusto GambleJody- states plans to go OOT tonight thru Wednesday

## 2016-03-05 ENCOUNTER — Encounter (HOSPITAL_COMMUNITY)
Admission: RE | Admit: 2016-03-05 | Discharge: 2016-03-05 | Disposition: A | Discharge status: Home / Self Care | Payer: Medicaid Other | Source: Ambulatory Visit | Attending: Orthopedic Surgery | Admitting: Orthopedic Surgery

## 2016-03-05 ENCOUNTER — Encounter (HOSPITAL_COMMUNITY): Payer: Self-pay

## 2016-03-05 DIAGNOSIS — Z0183 Encounter for blood typing: Secondary | ICD-10-CM | POA: Diagnosis not present

## 2016-03-05 DIAGNOSIS — Z01812 Encounter for preprocedural laboratory examination: Secondary | ICD-10-CM | POA: Diagnosis not present

## 2016-03-05 DIAGNOSIS — L559 Sunburn, unspecified: Secondary | ICD-10-CM

## 2016-03-05 DIAGNOSIS — M1611 Unilateral primary osteoarthritis, right hip: Secondary | ICD-10-CM | POA: Insufficient documentation

## 2016-03-05 HISTORY — DX: Unspecified osteoarthritis, unspecified site: M19.90

## 2016-03-05 HISTORY — DX: Sunburn, unspecified: L55.9

## 2016-03-05 HISTORY — DX: Headache: R51

## 2016-03-05 HISTORY — DX: Headache, unspecified: R51.9

## 2016-03-05 LAB — SURGICAL PCR SCREEN
MRSA, PCR: NEGATIVE
STAPHYLOCOCCUS AUREUS: NEGATIVE

## 2016-03-05 LAB — CBC
HEMATOCRIT: 42.1 % (ref 39.0–52.0)
HEMOGLOBIN: 15.3 g/dL (ref 13.0–17.0)
MCH: 39.3 pg — ABNORMAL HIGH (ref 26.0–34.0)
MCHC: 36.3 g/dL — AB (ref 30.0–36.0)
MCV: 108.2 fL — ABNORMAL HIGH (ref 78.0–100.0)
Platelets: 235 10*3/uL (ref 150–400)
RBC: 3.89 MIL/uL — ABNORMAL LOW (ref 4.22–5.81)
RDW: 14.7 % (ref 11.5–15.5)
WBC: 7.1 10*3/uL (ref 4.0–10.5)

## 2016-03-05 LAB — BASIC METABOLIC PANEL
ANION GAP: 7 (ref 5–15)
BUN: 7 mg/dL (ref 6–20)
CO2: 30 mmol/L (ref 22–32)
Calcium: 9 mg/dL (ref 8.9–10.3)
Chloride: 98 mmol/L — ABNORMAL LOW (ref 101–111)
Creatinine, Ser: 0.72 mg/dL (ref 0.61–1.24)
GFR calc Af Amer: >60 mL/min (ref >60)
GLUCOSE: 102 mg/dL — AB (ref 65–99)
POTASSIUM: 4 mmol/L (ref 3.5–5.1)
Sodium: 135 mmol/L (ref 135–145)

## 2016-03-05 NOTE — Patient Instructions (Addendum)
Timothy FrayJody D Foley  03/05/2016   Your procedure is scheduled on: 03/10/16  [facility]  Entrance [facility]  elevators to 3rd floor to  Short Stay Center at  0500 AM.  Call this number if you have problems the morning of surgery [phone]   Remember: ONLY 1 PERSON MAY GO WITH YOU TO SHORT STAY TO GET  READY MORNING OF YOUR SURGERY.  Do not eat food or drink liquids :After Midnight.     Take these medicines the morning of surgery with A SIP OF WATER: NONE DO NOT TAKE ANY DIABETIC MEDICATIONS DAY OF YOUR SURGERY                               You may not have any metal on your body including hair pins and              piercings  Do not wear jewelry, make-up, lotions, powders or perfumes, deodorant             Do not wear nail polish.  Do not shave  48 hours prior to surgery.              Men may shave face and neck.   Do not bring valuables to the hospital. [place] IS NOT             RESPONSIBLE   FOR VALUABLES.  Contacts, dentures or bridgework may not be worn into surgery.  Leave suitcase in the car. After surgery it may be brought to your room. DRIVER FOR DAY OF SURGERY  WIFE  Timothy Foley  [phone]  ______________________________________________             [facility] - Preparing for [facility] Before surgery, you can play an important role.  Because skin is not sterile, your skin needs to be as free of germs as possible.  You can reduce the number of germs on your skin by washing with CHG (chlorahexidine gluconate) soap before surgery.  CHG is an antiseptic cleaner which kills germs and bonds with the skin to continue killing germs even after washing. Please DO NOT use if you have an allergy to CHG or antibacterial soaps.  If your skin becomes reddened/irritated stop using the CHG and inform your nurse when you arrive at Short Stay. Do not shave (including legs and underarms) for at least 48 hours prior to the first CHG shower.  You may shave your  face/neck. Please follow these instructions carefully:  1.  Shower with CHG Soap the night before surgery and the  morning of Surgery.  2.  If you choose to wash your hair, wash your hair first as usual with your  normal  shampoo.  3.  After you shampoo, rinse your hair and body thoroughly to remove the  shampoo.                           4.  Use CHG as you would any other liquid soap.  You can apply chg directly  to the skin and wash                       Gently with a scrungie or clean washcloth.  5.  Apply the CHG Soap to your body ONLY FROM  THE NECK DOWN.   Do not use on face/ open                           Wound or open sores. Avoid contact with eyes, ears mouth and genitals (private parts).                       Wash face,  Genitals (private parts) with your normal soap.             6.  Wash thoroughly, paying special attention to the area where your surgery  will be performed.  7.  Thoroughly rinse your body with warm water from the neck down.  8.  DO NOT shower/wash with your normal soap after using and rinsing off  the CHG Soap.                9.  Pat yourself dry with a clean towel.            10.  Wear clean pajamas.            11.  Place clean sheets on your bed the night of your first shower and do not  sleep with pets. Day of Surgery : Do not apply any lotions/deodorants the morning of surgery.  Please wear clean clothes to the hospital/surgery center.  FAILURE TO FOLLOW THESE INSTRUCTIONS MAY RESULT IN THE CANCELLATION OF YOUR SURGERY PATIENT SIGNATURE_________________________________  NURSE SIGNATURE__________________________________  ________________________________________________________________________  WHAT IS A BLOOD TRANSFUSION? Blood Transfusion Information  A transfusion is the replacement of blood or some of its parts. Blood is made up of multiple cells which provide different functions.  Red blood cells carry oxygen and are used for blood loss  replacement.  White blood cells fight against infection.  Platelets control bleeding.  Plasma helps clot blood.  Other blood products are available for specialized needs, such as hemophilia or other clotting disorders. BEFORE THE TRANSFUSION  Who gives blood for transfusions?   Healthy volunteers who are fully evaluated to make sure their blood is safe. This is blood bank blood. Transfusion therapy is the safest it has ever been in the practice of medicine. Before blood is taken from a donor, a complete history is taken to make sure that person has no history of diseases nor engages in risky social behavior (examples are intravenous drug use or sexual activity with multiple partners). The donor's travel history is screened to minimize risk of transmitting infections, such as malaria. The donated blood is tested for signs of infectious diseases, such as HIV and hepatitis. The blood is then tested to be sure it is compatible with you in order to minimize the chance of a transfusion reaction. If you or a relative donates blood, this is often done in anticipation of surgery and is not appropriate for emergency situations. It takes many days to process the donated blood. RISKS AND COMPLICATIONS Although transfusion therapy is very safe and saves many lives, the main dangers of transfusion include:  1. Getting an infectious disease. 2. Developing a transfusion reaction. This is an allergic reaction to something in the blood you were given. Every precaution is taken to prevent this. The decision to have a blood transfusion has been considered carefully by your caregiver before blood is given. Blood is not given unless the benefits outweigh the risks. AFTER THE TRANSFUSION  Right after receiving a blood transfusion, you will usually feel much better and  more energetic. This is especially true if your red blood cells have gotten low (anemic). The transfusion raises the level of the red blood cells which  carry oxygen, and this usually causes an energy increase.  The nurse administering the transfusion will monitor you carefully for complications. HOME CARE INSTRUCTIONS  No special instructions are needed after a transfusion. You may find your energy is better. Speak with your caregiver about any limitations on activity for underlying diseases you may have. SEEK MEDICAL CARE IF:   Your condition is not improving after your transfusion.  You develop redness or irritation at the intravenous (IV) site. SEEK IMMEDIATE MEDICAL CARE IF:  Any of the following symptoms occur over the next 12 hours:  Shaking chills.  You have a temperature by mouth above 102 F (38.9 C), not controlled by medicine.  Chest, back, or muscle pain.  People around you feel you are not acting correctly or are confused.  Shortness of breath or difficulty breathing.  Dizziness and fainting.  You get a rash or develop hives.  You have a decrease in urine output.  Your urine turns a dark color or changes to pink, red, or brown. Any of the following symptoms occur over the next 10 days:  You have a temperature by mouth above 102 F (38.9 C), not controlled by medicine.  Shortness of breath.  Weakness after normal activity.  The white part of the eye turns yellow (jaundice).  You have a decrease in the amount of urine or are urinating less often.  Your urine turns a dark color or changes to pink, red, or brown. Document Released: 08/14/2000 Document Revised: 11/09/2011 Document Reviewed: 04/02/2008 ExitCare Patient Information 2014 [place].  _______________________________________________________________________  Incentive Spirometer  An incentive spirometer is a tool that can help keep your lungs clear and active. This tool measures how well you are filling your lungs with each breath. Taking long deep breaths may help reverse or decrease the chance of developing breathing (pulmonary) problems  (especially infection) following:  A long period of time when you are unable to move or be active. BEFORE THE PROCEDURE   If the spirometer includes an indicator to show your best effort, your nurse or respiratory therapist will set it to a desired goal.  If possible, sit up straight or lean slightly forward. Try not to slouch.  Hold the incentive spirometer in an upright position. INSTRUCTIONS FOR USE  3. Sit on the edge of your bed if possible, or sit up as far as you can in bed or on a chair. 4. Hold the incentive spirometer in an upright position. 5. Breathe out normally. 6. Place the mouthpiece in your mouth and seal your lips tightly around it. 7. Breathe in slowly and as deeply as possible, raising the piston or the ball toward the top of the column. 8. Hold your breath for 3-5 seconds or for as long as possible. Allow the piston or ball to fall to the bottom of the column. 9. Remove the mouthpiece from your mouth and breathe out normally. 10. Rest for a few seconds and repeat Steps 1 through 7 at least 10 times every 1-2 hours when you are awake. Take your time and take a few normal breaths between deep breaths. 11. The spirometer may include an indicator to show your best effort. Use the indicator as a goal to work toward during each repetition. 12. After each set of 10 deep breaths, practice coughing to be sure your lungs  are clear. If you have an incision (the cut made at the time of surgery), support your incision when coughing by placing a pillow or rolled up towels firmly against it. Once you are able to get out of bed, walk around indoors and cough well. You may stop using the incentive spirometer when instructed by your caregiver.  RISKS AND COMPLICATIONS  Take your time so you do not get dizzy or light-headed.  If you are in pain, you may need to take or ask for pain medication before doing incentive spirometry. It is harder to take a deep breath if you are having  pain. AFTER USE  Rest and breathe slowly and easily.  It can be helpful to keep track of a log of your progress. Your caregiver can provide you with a simple table to help with this. If you are using the spirometer at home, follow these instructions: [place] IF:   You are having difficultly using the spirometer.  You have trouble using the spirometer as often as instructed.  Your pain medication is not giving enough relief while using the spirometer.  You develop fever of 100.5 F (38.1 C) or higher. SEEK IMMEDIATE MEDICAL CARE IF:   You cough up bloody sputum that had not been present before.  You develop fever of 102 F (38.9 C) or greater.  You develop worsening pain at or near the incision site. MAKE SURE YOU:   Understand these instructions.  Will watch your condition.  Will get help right away if you are not doing well or get worse. Document Released: 12/28/2006 Document Revised: 11/09/2011 Document Reviewed: 02/28/2007 [facility] Patient Information 2014 [place], [place].   ________________________________________________________________________

## 2016-03-05 NOTE — Progress Notes (Signed)
PAT NOTE Seen for PST visit today. Timothy Foley has been to the beach and has severe sunburn bilateral legs-  Lower legs and feet the worst. States right foot has blister that popped- clear fluid, and left foot has several small blisters that have started to drain. Very painful. States has been using Solercaine.  I instructed him that he needs to go see PCP today and have these legs and feet looked at as his surgery is Tuesday.  Verbalizes understanding.  Also to notify Dr Charlann Boxerlin if they begin to look infercted, or if this burn isnt better by 03/10/16. Also states has been out of BP medication x 1 week- I also instructed him that he needs to get this filled and start today. Verbalized understanding.   Incentive spirometry instruction reviewed.

## 2016-03-05 NOTE — Progress Notes (Signed)
Above note faxed to Dr Charlann Boxerlin via epic

## 2016-03-09 NOTE — H&P (Signed)
TOTAL HIP ADMISSION H&P  Patient is admitted for right total hip arthroplasty, anterior approach.  Subjective:  Chief Complaint: Right hip primary OA / pain  HPI: Timothy Foley, 40 y.o. male, has a history of pain and functional disability in the right hip(s) due to arthritis and patient has failed non-surgical conservative treatments for greater than 12 weeks to include NSAID's and/or analgesics, corticosteriod injections, use of assistive devices and activity modification.  Onset of symptoms was gradual starting ~1 years ago with rapidlly worsening course since that time.The patient noted prior procedures of the hip to include arthroplasty on the left hip per Dr. Charlann Boxerlin on 12/17/2015.  Patient currently rates pain in the right hip at 10 out of 10 with activity. Patient has night pain, worsening of pain with activity and weight bearing, trendelenberg gait, pain that interfers with activities of daily living and pain with passive range of motion. Patient has evidence of periarticular osteophytes and joint space narrowing by imaging studies. This condition presents safety issues increasing the risk of falls.  There is no current active infection.   Risks, benefits and expectations were discussed with the patient.  Risks including but not limited to the risk of anesthesia, blood clots, nerve damage, blood vessel damage, failure of the prosthesis, infection and up to and including death.  Patient understand the risks, benefits and expectations and wishes to proceed with surgery.  PCP: Hadley PenOBBINS,ROBERT A, MD  D/C Plans:      Home  Post-op Meds:       No Rx given  Tranexamic Acid:      To be given - IV   Decadron:      Is to be given  FYI:     ASA  Norco  Nicotine Patch 14 mg (if not outpatient)   Spiritus  (if not outpatient)   Patient Active Problem List   Diagnosis Date Noted  . S/P left THA, AA 12/17/2015   Past Medical History  Diagnosis Date  . Hypertension   . Panic attacks      OCCASIONAL  . COPD (chronic obstructive pulmonary disease) (HCC)   . GERD (gastroesophageal reflux disease)   . Avascular necrosis of bone of right hip (HCC)   . Headache     migraines when runs out of BP medication  . Arthritis   . Sunburn 03/05/16    bilateral legs, lower legs bright red with sm amt swelling- feet have open draining blisters- states very much pain from his feet clear fluid    Past Surgical History  Procedure Laterality Date  . Pins to left wrist    . Tonsillectomy    . Total hip arthroplasty Left 12/17/2015    Procedure: LEFT TOTAL HIP ARTHROPLASTY ANTERIOR APPROACH;  Surgeon: Durene RomansMatthew Olin, MD;  Location: [facility];  Service: Orthopedics;  Laterality: Left;    No prescriptions prior to admission   Allergies  Allergen Reactions  . Erythromycin     "CHILDHOOD REACTION "    Social History  Substance Use Topics  . Smoking status: Current Every Day Smoker -- 1.00 packs/day for 25 years    Types: Cigarettes  . Smokeless tobacco: Never Used  . Alcohol Use: Yes     Comment: 1 FIFITH OF LIQUOR PER DAY       Review of Systems  Constitutional: Negative.   Eyes: Negative.   Respiratory: Negative.   Cardiovascular: Negative.   Gastrointestinal: Positive for heartburn.  Genitourinary: Negative.   Musculoskeletal: Positive for joint pain.  Skin: Negative.   Neurological: Positive for headaches.  Endo/Heme/Allergies: Negative.   Psychiatric/Behavioral: The patient is nervous/anxious.     Objective:  Physical Exam  Constitutional: He is oriented to person, place, and time. He appears well-developed.  HENT:  Head: Normocephalic.  Eyes: Pupils are equal, round, and reactive to light.  Neck: Neck supple. No JVD present. No tracheal deviation present. No thyromegaly present.  Cardiovascular: Normal rate, regular rhythm, normal heart sounds and intact distal pulses.   Respiratory: Effort normal and breath sounds normal. No stridor. No respiratory distress. He has no  wheezes.  GI: Soft. There is no tenderness. There is no guarding.  Musculoskeletal:       Right hip: He exhibits decreased range of motion, decreased strength, tenderness and bony tenderness. He exhibits no swelling, no deformity and no laceration.  Lymphadenopathy:    He has no cervical adenopathy.  Neurological: He is alert and oriented to person, place, and time.  Skin: Skin is warm and dry.  Psychiatric: He has a normal mood and affect.      Labs:  Estimated body mass index is 21.96 kg/(m^2) as calculated from the following:   Height as of 12/17/15:  (1.88 m).   Weight as of 12/05/15: 77.61 kg (171 lb 1.6 oz).   Imaging Review Plain radiographs demonstrate severe degenerative joint disease of the right hip(s). The bone quality appears to be good for age and reported activity level.  Assessment/Plan:  End stage arthritis, right hip(s)  The patient history, physical examination, clinical judgement of the provider and imaging studies are consistent with end stage degenerative joint disease of the right hip(s) and total hip arthroplasty is deemed medically necessary. The treatment options including medical management, injection therapy, arthroscopy and arthroplasty were discussed at length. The risks and benefits of total hip arthroplasty were presented and reviewed. The risks due to aseptic loosening, infection, stiffness, dislocation/subluxation,  thromboembolic complications and other imponderables were discussed.  The patient acknowledged the explanation, agreed to proceed with the plan and consent was signed. Patient is being admitted for inpatient treatment for surgery, pain control, PT, OT, prophylactic antibiotics, VTE prophylaxis, progressive ambulation and ADL's and discharge planning.The patient is planning to be discharged home.     Timothy Auerbach Shavontae Gibeault   PA-C  03/09/2016, 9:55 PM

## 2016-03-10 ENCOUNTER — Ambulatory Visit (HOSPITAL_COMMUNITY)
Admission: RE | Admit: 2016-03-10 | Discharge: 2016-03-10 | Disposition: A | Discharge status: Home / Self Care | Payer: Medicaid Other | Source: Ambulatory Visit | Attending: Orthopedic Surgery | Admitting: Orthopedic Surgery

## 2016-03-10 ENCOUNTER — Encounter (HOSPITAL_COMMUNITY)
Admission: RE | Disposition: A | Discharge status: Home / Self Care | Payer: Self-pay | Source: Ambulatory Visit | Attending: Orthopedic Surgery

## 2016-03-10 ENCOUNTER — Encounter (HOSPITAL_COMMUNITY): Payer: Self-pay | Admitting: *Deleted

## 2016-03-10 ENCOUNTER — Ambulatory Visit (HOSPITAL_COMMUNITY): Payer: Medicaid Other | Admitting: Registered Nurse

## 2016-03-10 ENCOUNTER — Ambulatory Visit (HOSPITAL_COMMUNITY): Payer: Medicaid Other

## 2016-03-10 DIAGNOSIS — F17210 Nicotine dependence, cigarettes, uncomplicated: Secondary | ICD-10-CM | POA: Diagnosis not present

## 2016-03-10 DIAGNOSIS — M1611 Unilateral primary osteoarthritis, right hip: Secondary | ICD-10-CM | POA: Diagnosis present

## 2016-03-10 DIAGNOSIS — I10 Essential (primary) hypertension: Secondary | ICD-10-CM | POA: Insufficient documentation

## 2016-03-10 DIAGNOSIS — Z96642 Presence of left artificial hip joint: Secondary | ICD-10-CM | POA: Insufficient documentation

## 2016-03-10 DIAGNOSIS — J449 Chronic obstructive pulmonary disease, unspecified: Secondary | ICD-10-CM | POA: Diagnosis not present

## 2016-03-10 DIAGNOSIS — M879 Osteonecrosis, unspecified: Secondary | ICD-10-CM | POA: Insufficient documentation

## 2016-03-10 DIAGNOSIS — Z96649 Presence of unspecified artificial hip joint: Secondary | ICD-10-CM

## 2016-03-10 HISTORY — PX: TOTAL HIP ARTHROPLASTY: SHX124

## 2016-03-10 LAB — TYPE AND SCREEN
ABO/RH(D): A POS
ANTIBODY SCREEN: NEGATIVE

## 2016-03-10 SURGERY — ARTHROPLASTY, HIP, TOTAL, ANTERIOR APPROACH
Anesthesia: Spinal | Site: Hip | Laterality: Right

## 2016-03-10 MED ORDER — FENTANYL CITRATE (PF) 250 MCG/5ML IJ SOLN
INTRAMUSCULAR | Status: AC
  Filled 2016-03-10: qty 5

## 2016-03-10 MED ORDER — HYDROMORPHONE HCL 2 MG PO TABS: 2.0000 mg | ORAL_TABLET | Freq: Four times a day (QID) | ORAL | Status: DC | PRN

## 2016-03-10 MED ORDER — AMLODIPINE BESYLATE 5 MG PO TABS
5.0000 mg | ORAL_TABLET | Freq: Every day | ORAL | Status: DC
  Administered 2016-03-10: 5 mg via ORAL
  Filled 2016-03-10: qty 1

## 2016-03-10 MED ORDER — MIDAZOLAM HCL 2 MG/2ML IJ SOLN
INTRAMUSCULAR | Status: AC
  Filled 2016-03-10: qty 2

## 2016-03-10 MED ORDER — CEFAZOLIN SODIUM-DEXTROSE 2-4 GM/100ML-% IV SOLN
INTRAVENOUS | Status: AC
  Filled 2016-03-10: qty 100

## 2016-03-10 MED ORDER — ONDANSETRON HCL 4 MG/2ML IJ SOLN
INTRAMUSCULAR | Status: DC | PRN
  Administered 2016-03-10: 4 mg via INTRAVENOUS

## 2016-03-10 MED ORDER — ACETAMINOPHEN 325 MG PO TABS: 325.0000 mg | ORAL_TABLET | ORAL | Status: DC | PRN

## 2016-03-10 MED ORDER — LIDOCAINE 2% (20 MG/ML) 5 ML SYRINGE
INTRAMUSCULAR | Status: DC | PRN
  Administered 2016-03-10: 50 mg via INTRAVENOUS

## 2016-03-10 MED ORDER — ACETAMINOPHEN 160 MG/5ML PO SOLN: 325.0000 mg | ORAL | Status: DC | PRN

## 2016-03-10 MED ORDER — OXYCODONE HCL 5 MG PO TABS
5.0000 mg | ORAL_TABLET | ORAL | Status: DC
  Administered 2016-03-10: 10 mg via ORAL
  Filled 2016-03-10: qty 2

## 2016-03-10 MED ORDER — OXYCODONE HCL 5 MG/5ML PO SOLN
5.0000 mg | Freq: Once | ORAL | Status: AC | PRN
  Filled 2016-03-10: qty 5

## 2016-03-10 MED ORDER — BUPIVACAINE HCL (PF) 0.5 % IJ SOLN
INTRAMUSCULAR | Status: DC | PRN
  Administered 2016-03-10: 3 mL via INTRATHECAL

## 2016-03-10 MED ORDER — FENTANYL CITRATE (PF) 100 MCG/2ML IJ SOLN
INTRAMUSCULAR | Status: DC | PRN
  Administered 2016-03-10 (×5): 50 ug via INTRAVENOUS

## 2016-03-10 MED ORDER — AMLODIPINE-OLMESARTAN 5-20 MG PO TABS: 1.0000 | ORAL_TABLET | Freq: Every morning | ORAL | Status: DC

## 2016-03-10 MED ORDER — PROPOFOL 10 MG/ML IV BOLUS
INTRAVENOUS | Status: AC
  Filled 2016-03-10: qty 20

## 2016-03-10 MED ORDER — HYDROMORPHONE HCL 1 MG/ML IJ SOLN
INTRAMUSCULAR | Status: AC
  Filled 2016-03-10: qty 1

## 2016-03-10 MED ORDER — CEFAZOLIN SODIUM-DEXTROSE 2-4 GM/100ML-% IV SOLN
2.0000 g | INTRAVENOUS | Status: AC
  Administered 2016-03-10: 2 g via INTRAVENOUS

## 2016-03-10 MED ORDER — HYDROMORPHONE HCL 1 MG/ML IJ SOLN: 0.5000 mg | INTRAMUSCULAR | Status: DC | PRN

## 2016-03-10 MED ORDER — PROPOFOL 10 MG/ML IV BOLUS
INTRAVENOUS | Status: AC
  Filled 2016-03-10: qty 40

## 2016-03-10 MED ORDER — DEXAMETHASONE SODIUM PHOSPHATE 10 MG/ML IJ SOLN
10.0000 mg | Freq: Once | INTRAMUSCULAR | Status: AC
  Administered 2016-03-10: 10 mg via INTRAVENOUS

## 2016-03-10 MED ORDER — LIDOCAINE HCL (CARDIAC) 20 MG/ML IV SOLN
INTRAVENOUS | Status: AC
  Filled 2016-03-10: qty 5

## 2016-03-10 MED ORDER — METHOCARBAMOL 500 MG PO TABS: 500.0000 mg | ORAL_TABLET | Freq: Four times a day (QID) | ORAL | Status: DC | PRN

## 2016-03-10 MED ORDER — MIDAZOLAM HCL 5 MG/5ML IJ SOLN
INTRAMUSCULAR | Status: DC | PRN
  Administered 2016-03-10: 2 mg via INTRAVENOUS

## 2016-03-10 MED ORDER — TRANEXAMIC ACID 1000 MG/10ML IV SOLN
1000.0000 mg | INTRAVENOUS | Status: AC
  Administered 2016-03-10: 1000 mg via INTRAVENOUS
  Filled 2016-03-10: qty 10

## 2016-03-10 MED ORDER — PROPOFOL 500 MG/50ML IV EMUL
INTRAVENOUS | Status: DC | PRN
  Administered 2016-03-10: 100 ug/kg/min via INTRAVENOUS

## 2016-03-10 MED ORDER — SODIUM CHLORIDE 0.9 % IR SOLN
Status: DC | PRN
  Administered 2016-03-10: 1000 mL

## 2016-03-10 MED ORDER — OXYCODONE HCL 5 MG PO TABS
5.0000 mg | ORAL_TABLET | Freq: Once | ORAL | Status: AC | PRN
  Administered 2016-03-10: 5 mg via ORAL
  Filled 2016-03-10: qty 1

## 2016-03-10 MED ORDER — METHOCARBAMOL 1000 MG/10ML IJ SOLN
500.0000 mg | Freq: Four times a day (QID) | INTRAVENOUS | Status: DC | PRN
  Administered 2016-03-10 (×2): 500 mg via INTRAVENOUS
  Filled 2016-03-10: qty 5
  Filled 2016-03-10 (×2): qty 550

## 2016-03-10 MED ORDER — SODIUM CHLORIDE 0.9 % IV BOLUS (SEPSIS): 500.0000 mL | Freq: Once | INTRAVENOUS | Status: DC

## 2016-03-10 MED ORDER — DEXAMETHASONE SODIUM PHOSPHATE 10 MG/ML IJ SOLN
INTRAMUSCULAR | Status: AC
  Filled 2016-03-10: qty 1

## 2016-03-10 MED ORDER — CEPHALEXIN 500 MG PO CAPS: 500.0000 mg | ORAL_CAPSULE | Freq: Three times a day (TID) | ORAL | Status: DC

## 2016-03-10 MED ORDER — STERILE WATER FOR IRRIGATION IR SOLN
Status: DC | PRN
  Administered 2016-03-10: 2000 mL

## 2016-03-10 MED ORDER — HYDROMORPHONE HCL 1 MG/ML IJ SOLN
0.2500 mg | INTRAMUSCULAR | Status: DC | PRN
  Administered 2016-03-10 (×2): 0.5 mg via INTRAVENOUS

## 2016-03-10 MED ORDER — HYDROCODONE-ACETAMINOPHEN 7.5-325 MG PO TABS: 1.0000 | ORAL_TABLET | ORAL | Status: DC | PRN

## 2016-03-10 MED ORDER — ASPIRIN EC 325 MG PO TBEC: 325.0000 mg | DELAYED_RELEASE_TABLET | Freq: Two times a day (BID) | ORAL | Status: AC

## 2016-03-10 MED ORDER — ONDANSETRON HCL 4 MG/2ML IJ SOLN
INTRAMUSCULAR | Status: AC
  Filled 2016-03-10: qty 2

## 2016-03-10 MED ORDER — LACTATED RINGERS IV SOLN
INTRAVENOUS | Status: DC | PRN
  Administered 2016-03-10 (×3): via INTRAVENOUS

## 2016-03-10 MED FILL — CEPHALEXIN 500 MG CAPSULE: 500 | 5 days supply | Qty: 15 | Fill #0

## 2016-03-10 MED FILL — HYDROCODON-APAP 7.5-325: 7.5-325 | 9 days supply | Qty: 100 | Fill #0

## 2016-03-10 MED FILL — HYDROmorphone HCL 2 MG TABS: 2 | 2 days supply | Qty: 16 | Fill #0

## 2016-03-10 MED FILL — METHOCARBAMOL 500 MG TABLET: 500 | 12 days supply | Qty: 50 | Fill #0

## 2016-03-10 SURGICAL SUPPLY — 34 items
BAG ZIPLOCK 12X15 (MISCELLANEOUS) ×3 IMPLANT
CAPT HIP TOTAL 2 ×3 IMPLANT
CLOTH BEACON ORANGE TIMEOUT ST (SAFETY) ×3 IMPLANT
COVER PERINEAL POST (MISCELLANEOUS) ×3 IMPLANT
DRAPE STERI IOBAN 125X83 (DRAPES) ×3 IMPLANT
DRAPE U-SHAPE 47X51 STRL (DRAPES) ×6 IMPLANT
DRESSING AQUACEL AG SP 3.5X10 (GAUZE/BANDAGES/DRESSINGS) ×1 IMPLANT
DRSG AQUACEL AG SP 3.5X10 (GAUZE/BANDAGES/DRESSINGS) ×3
DURAPREP 26ML APPLICATOR (WOUND CARE) ×3 IMPLANT
ELECT REM PT RETURN 9FT ADLT (ELECTROSURGICAL) ×3
ELECTRODE REM PT RTRN 9FT ADLT (ELECTROSURGICAL) ×1 IMPLANT
GLOVE BIOGEL PI IND STRL 7.0 (GLOVE) ×1 IMPLANT
GLOVE BIOGEL PI IND STRL 7.5 (GLOVE) ×3 IMPLANT
GLOVE BIOGEL PI IND STRL 8.5 (GLOVE) ×1 IMPLANT
GLOVE BIOGEL PI INDICATOR 7.0 (GLOVE) ×2
GLOVE BIOGEL PI INDICATOR 7.5 (GLOVE) ×6
GLOVE BIOGEL PI INDICATOR 8.5 (GLOVE) ×2
GLOVE ECLIPSE 8.0 STRL XLNG CF (GLOVE) ×6 IMPLANT
GLOVE ORTHO TXT STRL SZ7.5 (GLOVE) ×3 IMPLANT
GLOVE SURG SS PI 7.0 STRL IVOR (GLOVE) ×3 IMPLANT
GLOVE SURG SS PI 7.5 STRL IVOR (GLOVE) ×3 IMPLANT
GOWN STRL REUS W/TWL LRG LVL3 (GOWN DISPOSABLE) ×3 IMPLANT
GOWN STRL REUS W/TWL XL LVL3 (GOWN DISPOSABLE) ×9 IMPLANT
HOLDER FOLEY CATH W/STRAP (MISCELLANEOUS) ×3 IMPLANT
LIQUID BAND (GAUZE/BANDAGES/DRESSINGS) ×3 IMPLANT
PACK ANTERIOR HIP CUSTOM (KITS) ×3 IMPLANT
SAW OSC TIP CART 19.5X105X1.3 (SAW) ×3 IMPLANT
SUT MNCRL AB 4-0 PS2 18 (SUTURE) ×3 IMPLANT
SUT VIC AB 1 CT1 36 (SUTURE) ×9 IMPLANT
SUT VIC AB 2-0 CT1 27 (SUTURE) ×4
SUT VIC AB 2-0 CT1 TAPERPNT 27 (SUTURE) ×2 IMPLANT
SUT VLOC 180 0 24IN GS25 (SUTURE) ×3 IMPLANT
TRAY FOLEY W/METER SILVER 16FR (SET/KITS/TRAYS/PACK) ×3 IMPLANT
YANKAUER SUCT BULB TIP 10FT TU (MISCELLANEOUS) ×3 IMPLANT

## 2016-03-10 NOTE — Anesthesia Procedure Notes (Signed)
Spinal Patient location during procedure: OR Staffing Anesthesiologist: Collene Massimino Preanesthetic Checklist Completed: patient identified, surgical consent, pre-op evaluation, timeout performed, IV checked, risks and benefits discussed and monitors and equipment checked Spinal Block Patient position: sitting Prep: site prepped and draped and DuraPrep Patient monitoring: heart rate, cardiac monitor, continuous pulse ox and blood pressure Approach: midline Location: L3-4 Injection technique: single-shot Needle Needle type: Pencan  Needle gauge: 24 G Needle length: 10 cm Assessment Sensory level: T8   

## 2016-03-10 NOTE — Discharge Instructions (Addendum)
INSTRUCTIONS AFTER JOINT REPLACEMENT  ° °o Remove items at home which could result in a fall. This includes throw rugs or furniture in walking pathways °o ICE to the affected joint every three hours while awake for 30 minutes at a time, for at least the first 3-5 days, and then as needed for pain and swelling.  Continue to use ice for pain and swelling. You may notice swelling that will progress down to the foot and ankle.  This is normal after surgery.  Elevate your leg when you are not up walking on it.   °o Continue to use the breathing machine you got in the hospital (incentive spirometer) which will help keep your temperature down.  It is common for your temperature to cycle up and down following surgery, especially at night when you are not up moving around and exerting yourself.  The breathing machine keeps your lungs expanded and your temperature down. ° ° °DIET:  As you were doing prior to hospitalization, we recommend a well-balanced diet. ° °DRESSING / WOUND CARE / SHOWERING ° °Keep the surgical dressing until follow up.  The dressing is water proof, so you can shower without any extra covering.  IF THE DRESSING FALLS OFF or the wound gets wet inside, change the dressing with sterile gauze.  Please use good hand washing techniques before changing the dressing.  Do not use any lotions or creams on the incision until instructed by your surgeon.   ° °ACTIVITY ° °o Increase activity slowly as tolerated, but follow the weight bearing instructions below.   °o No driving for 6 weeks or until further direction given by your physician.  You cannot drive while taking narcotics.  °o No lifting or carrying greater than 10 lbs. until further directed by your surgeon. °o Avoid periods of inactivity such as sitting longer than an hour when not asleep. This helps prevent blood clots.  °o You may return to work once you are authorized by your doctor.  ° ° ° °WEIGHT BEARING  ° °Weight bearing as tolerated with assist  device (walker, cane, etc) as directed, use it as long as suggested by your surgeon or therapist, typically at least 4-6 weeks. ° ° °EXERCISES ° °Results after joint replacement surgery are often greatly improved when you follow the exercise, range of motion and muscle strengthening exercises prescribed by your doctor. Safety measures are also important to protect the joint from further injury. Any time any of these exercises cause you to have increased pain or swelling, decrease what you are doing until you are comfortable again and then slowly increase them. If you have problems or questions, call your caregiver or physical therapist for advice.  ° °Rehabilitation is important following a joint replacement. After just a few days of immobilization, the muscles of the leg can become weakened and shrink (atrophy).  These exercises are designed to build up the tone and strength of the thigh and leg muscles and to improve motion. Often times heat used for twenty to thirty minutes before working out will loosen up your tissues and help with improving the range of motion but do not use heat for the first two weeks following surgery (sometimes heat can increase post-operative swelling).  ° °These exercises can be done on a training (exercise) mat, on the floor, on a table or on a bed. Use whatever works the best and is most comfortable for you.    Use music or television while you are exercising so that   the exercises are a pleasant break in your day. This will make your life better with the exercises acting as a break in your routine that you can look forward to.   Perform all exercises about fifteen times, three times per day or as directed.  You should exercise both the operative leg and the other leg as well. ° °Exercises include: °  °• Quad Sets - Tighten up the muscle on the front of the thigh (Quad) and hold for 5-10 seconds.   °• Straight Leg Raises - With your knee straight (if you were given a brace, keep it on),  lift the leg to 60 degrees, hold for 3 seconds, and slowly lower the leg.  Perform this exercise against resistance later as your leg gets stronger.  °• Leg Slides: Lying on your back, slowly slide your foot toward your buttocks, bending your knee up off the floor (only go as far as is comfortable). Then slowly slide your foot back down until your leg is flat on the floor again.  °• Angel Wings: Lying on your back spread your legs to the side as far apart as you can without causing discomfort.  °• Hamstring Strength:  Lying on your back, push your heel against the floor with your leg straight by tightening up the muscles of your buttocks.  Repeat, but this time bend your knee to a comfortable angle, and push your heel against the floor.  You may put a pillow under the heel to make it more comfortable if necessary.  ° °A rehabilitation program following joint replacement surgery can speed recovery and prevent re-injury in the future due to weakened muscles. Contact your doctor or a physical therapist for more information on knee rehabilitation.  ° ° °CONSTIPATION ° °Constipation is defined medically as fewer than three stools per week and severe constipation as less than one stool per week.  Even if you have a regular bowel pattern at home, your normal regimen is likely to be disrupted due to multiple reasons following surgery.  Combination of anesthesia, postoperative narcotics, change in appetite and fluid intake all can affect your bowels.  ° °YOU MUST use at least one of the following options; they are listed in order of increasing strength to get the job done.  They are all available over the counter, and you may need to use some, POSSIBLY even all of these options:   ° °Drink plenty of fluids (prune juice may be helpful) and high fiber foods °Colace 100 mg by mouth twice a day  °Senokot for constipation as directed and as needed Dulcolax (bisacodyl), take with full glass of water  °Miralax (polyethylene glycol)  once or twice a day as needed. ° °If you have tried all these things and are unable to have a bowel movement in the first 3-4 days after surgery call either your surgeon or your primary doctor.   ° °If you experience loose stools or diarrhea, hold the medications until you stool forms back up.  If your symptoms do not get better within 1 week or if they get worse, check with your doctor.  If you experience "the worst abdominal pain ever" or develop nausea or vomiting, please contact the office immediately for further recommendations for treatment. ° ° °ITCHING:  If you experience itching with your medications, try taking only a single pain pill, or even half a pain pill at a time.  You can also use Benadryl over the counter for itching or also to   help with sleep.  ° °TED HOSE STOCKINGS:  Use stockings on both legs until for at least 2 weeks or as directed by physician office. They may be removed at night for sleeping. ° °MEDICATIONS:  See your medication summary on the “After Visit Summary” that nursing will review with you.  You may have some home medications which will be placed on hold until you complete the course of blood thinner medication.  It is important for you to complete the blood thinner medication as prescribed. ° °PRECAUTIONS:  If you experience chest pain or shortness of breath - call 911 immediately for transfer to the hospital emergency department.  ° °If you develop a fever greater that 101 F, purulent drainage from wound, increased redness or drainage from wound, foul odor from the wound/dressing, or calf pain - CONTACT YOUR SURGEON.   °                                                °FOLLOW-UP APPOINTMENTS:  If you do not already have a post-op appointment, please call the office for an appointment to be seen by your surgeon.  Guidelines for how soon to be seen are listed in your “After Visit Summary”, but are typically between 1-4 weeks after surgery. ° °OTHER INSTRUCTIONS:  ° °Knee  Replacement:  Do not place pillow under knee, focus on keeping the knee straight while resting.  ° °MAKE SURE YOU:  °• Understand these instructions.  °• Get help right away if you are not doing well or get worse.  ° ° °Thank you for letting us be a part of your medical care team.  It is a privilege we respect greatly.  We hope these instructions will help you stay on track for a fast and full recovery!  °  ° ° ° °Spinal Anesthesia and Epidural Anesthesia, Care After °Refer to this sheet in the next few weeks. These instructions provide you with information about caring for yourself after your procedure. Your health care provider may also give you more specific instructions. Your treatment has been planned according to current medical practices, but problems sometimes occur. Call your health care provider if you have any problems or questions after your procedure. °WHAT TO EXPECT AFTER THE PROCEDURE °After your procedure, it is typical to have the following: °· Sleepiness. °· Nausea and vomiting. °HOME CARE INSTRUCTIONS °· For the first 24 hours after anesthetic medicine: °¨ Do not drive or operate heavy machinery. °¨ Do not drink alcohol. °¨ Do not make important decisions. °· Have someone stay with you for at least 24-48 hours. °· Drink enough fluid to keep your urine clear or pale yellow. °SEEK MEDICAL CARE IF: °· You have nausea and vomiting that continue the day after anesthetic medicine was given. °· You develop a rash. °SEEK IMMEDIATE MEDICAL CARE IF:  °· You have a fever. °· You have a persistent or severe headache. °· You develop blurred or double vision. °· You develop dizziness or lightheadedness. °· You faint. °· You have weakness, numbness, or tingling in your arms or legs. °· You have difficulty breathing. °· You are unable to pass urine. °  °This information is not intended to replace advice given to you by your health care provider. Make sure you discuss any questions you have with your health care  provider. °  °Document   Released: 11/07/2003 Document Revised: 09/07/2014 Document Reviewed: 03/28/2014 °Elsevier Interactive Patient Education ©2016 [employer] ° °

## 2016-03-10 NOTE — Anesthesia Postprocedure Evaluation (Signed)
Anesthesia Post Note  Patient: Timothy Foley  Procedure(s) Performed: Procedure(s) (LRB): RIGHT TOTAL HIP ARTHROPLASTY ANTERIOR APPROACH (Right)  Patient location during evaluation: PACU Anesthesia Type: Spinal Level of consciousness: awake Pain management: pain level controlled Vital Signs Assessment: post-procedure vital signs reviewed and stable Respiratory status: spontaneous breathing Cardiovascular status: stable Postop Assessment: no signs of nausea or vomiting and spinal receding Anesthetic complications: no    Last Vitals:  Filed Vitals:   03/10/16 1115 03/10/16 1130  BP: 136/94 132/95  Pulse: 92   Temp:  36.5 C  Resp: 16 16    Last Pain:  Filed Vitals:   03/10/16 1634  PainSc: 3                  [name]

## 2016-03-10 NOTE — Transfer of Care (Signed)
Immediate Anesthesia Transfer of Care Note  Patient: Timothy Foley  Procedure(s) Performed: Procedure(s): RIGHT TOTAL HIP ARTHROPLASTY ANTERIOR APPROACH (Right)  Patient Location: PACU  Anesthesia Type:Spinal  Level of Consciousness:  sedated, patient cooperative and responds to stimulation  Airway & Oxygen Therapy:Patient Spontanous Breathing and Patient connected to face mask oxgen  Post-op Assessment:  Report given to PACU RN and Post -op Vital signs reviewed and stable  Post vital signs:  Reviewed and stable  Last Vitals:  Filed Vitals:   03/10/16 0515 03/10/16 0541  BP: 146/75 146/75  Pulse: 93   Temp: 36.7 C   Resp: 18     Complications: No apparent anesthesia complications

## 2016-03-10 NOTE — Progress Notes (Signed)
Sunburn  Noted to bilateral lower legs.. Left lower leg swollen..Marland Kitchen

## 2016-03-10 NOTE — Op Note (Signed)
NAME:  Timothy Foley                ACCOUNT NO.: [id number]      MEDICAL RECORD NO.: [id number]      FACILITY:  [facility]      PHYSICIAN:  Durene Romans D  DATE OF BIRTH:  April 01, 1976     DATE OF PROCEDURE:  03/10/2016                                 OPERATIVE REPORT         PREOPERATIVE DIAGNOSIS: Right  hip avascular necrosis.      POSTOPERATIVE DIAGNOSIS:  Right hip avascular necrosis.      PROCEDURE:  Right total hip replacement through an anterior approach   utilizing DePuy THR system, component size 54mm pinnacle cup, a size 36+4 neutral   Altrex liner, a size 9 Hi Tri Lock stem with a 36+1.5 delta ceramic   ball.      SURGEON:  Madlyn Frankel. Charlann Boxer, M.D.      ASSISTANT:  Lanney Gins, PA-C     ANESTHESIA:  Spinal.      SPECIMENS:  None.      COMPLICATIONS:  None.      BLOOD LOSS:  500 cc     DRAINS:  None.      INDICATION OF THE PROCEDURE:  CHASIN Foley is a 40 y.o. male who had   presented to office for evaluation of right hip pain.  Radiographs revealed   progressive femoral head collapse and sclerosis related to advanced avascular necrosis of the femoral head (similar to left hip, previously replaced).  He had a very painful limited range of   motion significantly affecting their overall quality of life.  The patient was failing to    respond to conservative measures, and at this point was ready   to proceed with more definitive measures.  The patient has noted progressive   degenerative changes in his hip, progressive problems and dysfunction   with regarding the hip prior to surgery.  Consent was obtained for   benefit of pain relief.  Specific risk of infection, DVT, component   failure, dislocation, need for revision surgery, as well discussion of   the anterior versus posterior approach were reviewed.  Consent was   obtained for benefit of anterior pain relief through an anterior   approach.      PROCEDURE IN DETAIL:  The patient  was brought to operative theater.   Once adequate anesthesia, preoperative antibiotics, 2gm of Ancef, 1 gm of Tranexamic Acid, and 10 mg of Decadron administered.   The patient was positioned supine on the OSI Hanna table.  Once adequate   padding of boney process was carried out, we had predraped out the hip, and  used fluoroscopy to confirm orientation of the pelvis and position.      The right hip was then prepped and draped from proximal iliac crest to   mid thigh with shower curtain technique.      Time-out was performed identifying the patient, planned procedure, and   extremity.     An incision was then made 2 cm distal and lateral to the   anterior superior iliac spine extending over the orientation of the   tensor fascia lata muscle and sharp dissection was carried down to the   fascia of the muscle and protractor placed in  the soft tissues.      The fascia was then incised.  The muscle belly was identified and swept   laterally and retractor placed along the superior neck.  Following   cauterization of the circumflex vessels and removing some pericapsular   fat, a second cobra retractor was placed on the inferior neck.  A third   retractor was placed on the anterior acetabulum after elevating the   anterior rectus.  A L-capsulotomy was along the line of the   superior neck to the trochanteric fossa, then extended proximally and   distally.  Tag sutures were placed and the retractors were then placed   intracapsular.  We then identified the trochanteric fossa and   orientation of my neck cut, confirmed this radiographically   and then made a neck osteotomy with the femur on traction.  The femoral   head was removed without difficulty or complication.  Traction was let   off and retractors were placed posterior and anterior around the   acetabulum.      The labrum and foveal tissue were debrided.  I began reaming with a 48mm   reamer and reamed up to 53mm reamer with good  bony bed preparation and a 54mm   cup was chosen.  The final 54mm Pinnacle cup was then impacted under fluoroscopy  to confirm the depth of penetration and orientation with respect to   abduction.  A hole eliminator was place, no screw placed to due fit and screw hole orientation.  The final   36+4 neutarl Altrex liner was impacted with good visualized rim fit.  The cup was positioned anatomically within the acetabular portion of the pelvis.      At this point, the femur was rolled at 80 degrees.  Further capsule was   released off the inferior aspect of the femoral neck.  I then   released the superior capsule proximally.  The hook was placed laterally   along the femur and elevated manually and held in position with the bed   hook.  The leg was then extended and adducted with the leg rolled to 100   degrees of external rotation.  Once the proximal femur was fully   exposed, I used a box osteotome to set orientation.  I then began   broaching with the starting chili pepper broach and passed this by hand and then broached up to 9 to match the other hip.  With the 9 broach in place I chose a high offset neck and did trial reductions.  The offset was appropriate, leg lengths   appeared to be equal, confirmed radiographically.   Given these findings, I went ahead and dislocated the hip, repositioned all   retractors and positioned the right hip in the extended and abducted position.  The final 9 Hi Tri Lock stem was   chosen and it was impacted down to the level of neck cut.  Based on this   and the trial reduction, a 36+1.5 delta ceramic ball was chosen and   impacted onto a clean and dry trunnion, and the hip was reduced.  The   hip had been irrigated throughout the case again at this point.  I did   reapproximate the superior capsular leaflet to the anterior leaflet   using #1 Vicryl.  The fascia of the   tensor fascia lata muscle was then reapproximated using #1 Vicryl and #0 V-lock sutures.   The   remaining wound was closed with  2-0 Vicryl and running 4-0 Monocryl.   The hip was cleaned, dried, and dressed sterilely using Dermabond and   Aquacel dressing.  He was then brought   to recovery room in stable condition tolerating the procedure well.    Lanney GinsMatthew Babish, PA-C was present for the entirety of the case involved from   preoperative positioning, perioperative retractor management, general   facilitation of the case, as well as primary wound closure as assistant.            Madlyn FrankelMatthew D. Charlann Boxerlin, M.D.        03/10/2016 8:45 AM

## 2016-03-10 NOTE — Progress Notes (Signed)
Patient has rolling walker and 3 in 1 BSC at home from previous hip surgery. Given urinal for convenience to take home.

## 2016-03-10 NOTE — Interval H&P Note (Signed)
History and Physical Interval Note:  03/10/2016 7:11 AM  Timothy Foley  has presented today for surgery, with the diagnosis of RIGHT HIP OA  The various methods of treatment have been discussed with the patient and family. After consideration of risks, benefits and other options for treatment, the patient has consented to  Procedure(s): RIGHT TOTAL HIP ARTHROPLASTY ANTERIOR APPROACH (Right) as a surgical intervention .  The patient's history has been reviewed, patient examined, no change in status, stable for surgery.  I have reviewed the patient's chart and labs.  Questions were answered to the patient's satisfaction.     [name],[name] D

## 2016-03-10 NOTE — Anesthesia Preprocedure Evaluation (Addendum)
Anesthesia Evaluation  Patient identified by MRN, date of birth, ID band Patient awake    Reviewed: Allergy & Precautions, NPO status , Patient's Chart, lab work & pertinent test results  Airway Mallampati: II  TM Distance: >3 FB Neck ROM: Full    Dental  (+) Missing, Dental Advisory Given, Teeth Intact,    Pulmonary COPD, Current Smoker,    Pulmonary exam normal breath sounds clear to auscultation       Cardiovascular Exercise Tolerance: Good hypertension, Pt. on medications Normal cardiovascular exam Rhythm:Regular Rate:Normal     Neuro/Psych  Headaches, PSYCHIATRIC DISORDERS Anxiety    GI/Hepatic Neg liver ROS, GERD  Controlled,  Endo/Other  negative endocrine ROS  Renal/GU negative Renal ROS  negative genitourinary   Musculoskeletal  (+) Arthritis ,   Abdominal   Peds negative pediatric ROS (+)  Hematology negative hematology ROS (+)   Anesthesia Other Findings   Reproductive/Obstetrics negative OB ROS                           Anesthesia Physical Anesthesia Plan  ASA: II  Anesthesia Plan: Spinal   Post-op Pain Management:    Induction:   Airway Management Planned: Natural Airway, Nasal Cannula and Simple Face Mask  Additional Equipment: None  Intra-op Plan:   Post-operative Plan:   Informed Consent: I have reviewed the patients History and Physical, chart, labs and discussed the procedure including the risks, benefits and alternatives for the proposed anesthesia with the patient or authorized representative who has indicated his/her understanding and acceptance.   Dental advisory given  Plan Discussed with: CRNA and Surgeon  Anesthesia Plan Comments:        Anesthesia Quick Evaluation                                  Anesthesia Evaluation  Patient identified by MRN, date of birth, ID band Patient awake    Reviewed: Allergy & Precautions, NPO status ,  Patient's Chart, lab work & pertinent test results  Airway Mallampati: II  TM Distance: >3 FB Neck ROM: Full    Dental no notable dental hx.    Pulmonary COPD, Current Smoker,    Pulmonary exam normal breath sounds clear to auscultation       Cardiovascular Exercise Tolerance: Good hypertension, Pt. on medications Normal cardiovascular exam Rhythm:Regular Rate:Normal     Neuro/Psych negative neurological ROS  negative psych ROS   GI/Hepatic negative GI ROS, Neg liver ROS, GERD  Medicated,  Endo/Other  negative endocrine ROS  Renal/GU negative Renal ROS  negative genitourinary   Musculoskeletal negative musculoskeletal ROS (+)   Abdominal   Peds negative pediatric ROS (+)  Hematology negative hematology ROS (+)   Anesthesia Other Findings   Reproductive/Obstetrics negative OB ROS                             Anesthesia Physical Anesthesia Plan  ASA: II  Anesthesia Plan: Spinal   Post-op Pain Management:    Induction: Intravenous  Airway Management Planned:   Additional Equipment:   Intra-op Plan:   Post-operative Plan: Extubation in OR  Informed Consent: I have reviewed the patients History and Physical, chart, labs and discussed the procedure including the risks, benefits and alternatives for the proposed anesthesia with the patient or authorized representative who has indicated his/her understanding and  acceptance.   Dental advisory given  Plan Discussed with: CRNA  Anesthesia Plan Comments: (Discussed risks and benefits of and differences between spinal and general. Discussed risks of spinal including headache, backache, failure, bleeding and hematoma, infection, and nerve damage. Patient consents to spinal. Questions answered. Coagulation studies and platelet count acceptable.)       Anesthesia Quick Evaluation

## 2016-03-10 NOTE — Progress Notes (Signed)
PT Cancellation Note  Patient Details Name: Gena FrayJody D Chao MRN: [id number] DOB: 05-Apr-1976   Cancelled Treatment:    Reason Eval/Treat Not Completed: Medical issues which prohibited therapy (spinal not yet worn off, pt to be catheterized by RN now, will attempt later)   Tamala SerUhlenberg, Sennie Borden Kistler 03/10/2016, 2:21 PM [phone]

## 2016-03-10 NOTE — Evaluation (Signed)
Physical Therapy Evaluation Patient Details Name: Timothy Foley MRN: [id number] DOB: October 23, 1975 Today's Date: 03/10/2016   History of Present Illness  40 yo male s/p R THA-DA 03/10/16.   Clinical Impression  Pt ambulated 15' with RW without loss of balance. Instructed pt in THA exercises for home, he demonstrated good understanding. From PT standpoint he is ready to DC home.     Follow Up Recommendations No PT follow up    Equipment Recommendations  None recommended by PT    Recommendations for Other Services       Precautions / Restrictions Precautions Precautions: Fall Restrictions Weight Bearing Restrictions: No      Mobility  Bed Mobility Overal bed mobility: Needs Assistance Bed Mobility: Supine to Sit;Sit to Supine     Supine to sit: Min assist Sit to supine: Min assist   General bed mobility comments: min A to pivot hips to EOB, min A for RLE into bed  Transfers Overall transfer level: Modified independent Equipment used: Rolling walker (2 wheeled) Transfers: Sit to/from Stand Sit to Stand: Modified independent (Device/Increase time)            Ambulation/Gait Ambulation/Gait assistance: Supervision Ambulation Distance (Feet): 70 Feet Assistive device: Rolling walker (2 wheeled) Gait Pattern/deviations: Step-to pattern     General Gait Details: steady with RW, no LOB  Stairs Stairs:  (pt declined stair training, stated he remembers how to do stairs from recent L THA. Verbally reviewed technique. )          Wheelchair Mobility    Modified Rankin (Stroke Patients Only)       Balance Overall balance assessment: Modified Independent                                           Pertinent Vitals/Pain Pain Assessment: 0-10 Pain Score: 6  Pain Location: R hip with walking Pain Descriptors / Indicators: Sore Pain Intervention(s): Premedicated before session;Monitored during session;Limited activity within patient's  tolerance (pt declined ice, stated it increased pain)    Home Living Family/patient expects to be discharged to:: Private residence Living Arrangements: Spouse/significant other;Children Available Help at Discharge: Family;Available 24 hours/day Type of Home: House Home Access: Stairs to enter Entrance Stairs-Rails: Right Entrance Stairs-Number of Steps: 3 Home Layout: One level Home Equipment: Walker - 2 wheels;Crutches      Prior Function Level of Independence: Independent               Hand Dominance        Extremity/Trunk Assessment   Upper Extremity Assessment: Overall WFL for tasks assessed           Lower Extremity Assessment: RLE deficits/detail RLE Deficits / Details: knee ext -3/5, SLR +2/5, ankle WNL, sensation intact to light touch    Cervical / Trunk Assessment: Normal  Communication   Communication: No difficulties  Cognition Arousal/Alertness: Awake/alert Behavior During Therapy: WFL for tasks assessed/performed Overall Cognitive Status: Within Functional Limits for tasks assessed                      General Comments      Exercises Total Joint Exercises Ankle Circles/Pumps: AROM;Both;10 reps Heel Slides: AAROM;Right;10 reps;Supine Hip ABduction/ADduction: AAROM;Right;10 reps;Supine Long Arc Quad: AROM;Right;5 reps;Seated      Assessment/Plan    PT Assessment Patent does not need any further PT services  PT Diagnosis Acute pain  PT Problem List    PT Treatment Interventions     PT Goals (Current goals can be found in the Care Plan section) Acute Rehab PT Goals 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 PT Goal Formulation: All assessment and education complete, DC therapy    Frequency     Barriers to discharge        Co-evaluation               End of Session Equipment Utilized During Treatment: Gait belt Activity Tolerance: Treatment limited secondary to medical complications (Comment) (pt reported  nausea following tx, he declined nausea meds) Patient left: in bed;with call bell/phone within reach Nurse Communication: Mobility status         Time: 1430-1458 PT Time Calculation (min) (ACUTE ONLY): 28 min   Charges:   PT Evaluation $PT Eval Low Complexity: 1 Procedure PT Treatments $Gait Training: 8-22 mins   PT G Codes:        Tamala SerUhlenberg, Chestine Belknap Kistler 03/10/2016, 3:09 PM [phone]

## 2016-08-24 ENCOUNTER — Encounter: Payer: Self-pay | Admitting: Physician Assistant

## 2016-08-24 DIAGNOSIS — Z720 Tobacco use: Secondary | ICD-10-CM | POA: Insufficient documentation

## 2016-08-24 DIAGNOSIS — Z7289 Other problems related to lifestyle: Secondary | ICD-10-CM | POA: Insufficient documentation

## 2016-08-24 DIAGNOSIS — F1090 Alcohol use, unspecified, uncomplicated: Secondary | ICD-10-CM | POA: Insufficient documentation

## 2016-08-24 DIAGNOSIS — I10 Essential (primary) hypertension: Secondary | ICD-10-CM | POA: Insufficient documentation

## 2016-08-24 NOTE — Progress Notes (Deleted)
Cardiology Office Note    Date:  08/24/2016  ID:  Timothy Foley, DOB September 30, 1975, MRN [id number] PCP:  Hadley PenOBBINS,ROBERT A, MD  Cardiologist:  ***   Chief Complaint: swelling  History of Present Illness:  Timothy Foley is a 40 y.o. male with history of tobacco abuse, habitual alcohol intake, R hip avascular necrosis s/p R THA 02/2016, panic attacks, HTN, GERD, ?COPD, arthritis who presents for evaluation of lower extremity swelling. Last labs 02/2016: Hgb 15.3, MCV 108, K 4.0, Cr 0.72; prior LFTs in 11/2015 showed AST 98 and AP 151.  pmh tob etoh (fifth)? dru famhx   cmet     Past Medical History:  Diagnosis Date  . Arthritis   . Avascular necrosis of bone of right hip (HCC)   . COPD (chronic obstructive pulmonary disease) (HCC)   . GERD (gastroesophageal reflux disease)   . Headache    migraines when runs out of BP medication  . Hypertension   . Panic attacks    OCCASIONAL  . Sunburn 03/05/16   bilateral legs, lower legs bright red with sm amt swelling- feet have open draining blisters- states very much pain from his feet clear fluid  . Tobacco abuse     Past Surgical History:  Procedure Laterality Date  . PINS TO LEFT WRIST    . TONSILLECTOMY    . TOTAL HIP ARTHROPLASTY Left 12/17/2015   Procedure: LEFT TOTAL HIP ARTHROPLASTY ANTERIOR APPROACH;  Surgeon: Durene RomansMatthew Olin, MD;  Location: [facility];  Service: Orthopedics;  Laterality: Left;  . TOTAL HIP ARTHROPLASTY Right 03/10/2016   Procedure: RIGHT TOTAL HIP ARTHROPLASTY ANTERIOR APPROACH;  Surgeon: Durene RomansMatthew Olin, MD;  Location: [facility];  Service: Orthopedics;  Laterality: Right;    Current Medications: Current Outpatient Prescriptions  Medication Sig Dispense Refill  . amLODipine-olmesartan (AZOR) 5-20 MG tablet Take 1 tablet by mouth every morning.    . cephALEXin (KEFLEX) 500 MG capsule Take 1 capsule (500 mg total) by mouth 3 (three) times daily. 15 capsule 0  . HYDROcodone-acetaminophen (NORCO) 7.5-325 MG tablet  Take 1-2 tablets by mouth every 4 (four) hours as needed for moderate pain. 100 tablet 0  . HYDROmorphone (DILAUDID) 2 MG tablet Take 1-2 tablets (2-4 mg total) by mouth every 6 (six) hours as needed for severe pain (breakthrough pain). 16 tablet 0  . methocarbamol (ROBAXIN) 500 MG tablet Take 1 tablet (500 mg total) by mouth every 6 (six) hours as needed for muscle spasms. 50 tablet 0   No current facility-administered medications for this visit.      Allergies:   Erythromycin   Social History   Social History  . Marital status: Married    Spouse name: N/A  . Number of children: N/A  . Years of education: N/A   Social History Main Topics  . Smoking status: Current Every Day Smoker    Packs/day: 1.00    Years: 25.00    Types: Cigarettes  . Smokeless tobacco: Never Used  . Alcohol use Yes     Comment: 1 FIFITH OF LIQUOR PER DAY  . Drug use: No  . Sexual activity: Not on file   Other Topics Concern  . Not on file   Social History Narrative  . No narrative on file     Family History:  The patient's family history is not on file. ***  ROS:   Please see the history of present illness. Otherwise, review of systems is positive for ***.  All other systems are  reviewed and otherwise negative.    PHYSICAL EXAM:   VS:  There were no vitals taken for this visit.  BMI: There is no height or weight on file to calculate BMI. GEN: Well nourished, well developed, in no acute distress HEENT: normocephalic, atraumatic Neck: no JVD, carotid bruits, or masses Cardiac: ***RRR; no murmurs, rubs, or gallops, no edema  Respiratory:  clear to auscultation bilaterally, normal work of breathing GI: soft, nontender, nondistended, + BS MS: no deformity or atrophy Skin: warm and dry, no rash Neuro:  Alert and Oriented x 3, Strength and sensation are intact, follows commands Psych: euthymic mood, full affect  Wt Readings from Last 3 Encounters:  03/10/16 178 lb (80.7 kg)  03/05/16 178 lb  12.8 oz (81.1 kg)  12/17/15 171 lb 6 oz (77.7 kg)      Studies/Labs Reviewed:   EKG:  EKG was ordered today and personally reviewed by me and demonstrates *** EKG was not ordered today.***  Recent Labs: 12/05/2015: ALT 54 03/05/2016: BUN 7; Creatinine, Ser 0.72; Hemoglobin 15.3; Platelets 235; Potassium 4.0; Sodium 135   Lipid Panel No results found for: CHOL, TRIG, HDL, CHOLHDL, VLDL, LDLCALC, LDLDIRECT  Additional studies/ records that were reviewed today include: Summarized above.***    ASSESSMENT & PLAN:   1. Lower extremity edema 2. Essential HTN 3. Tobacco abuse 4. Habitual alcohol use  Disposition: F/u with ***   Medication Adjustments/Labs and Tests Ordered: Current medicines are reviewed at length with the patient today.  Concerns regarding medicines are outlined above. Medication changes, Labs and Tests ordered today are summarized above and listed in the Patient Instructions accessible in Encounters.   Thomasene MohairSigned, Dayna Dunn PA-C  08/24/2016 8:47 AM    [facility] [address], [place], [place]  [address] Phone: [phone]; Fax: [phone]

## 2016-08-25 ENCOUNTER — Telehealth: Payer: Self-pay | Admitting: Cardiology

## 2016-08-25 ENCOUNTER — Ambulatory Visit: Payer: Medicaid Other | Admitting: Physician Assistant

## 2016-08-25 NOTE — Telephone Encounter (Signed)
[facility]11/16 with Dr Jens Somrenshaw.  [facility] for Dr Ludwig Clarksrenshaw's schedule on 09/11/16. lp

## 2016-09-08 NOTE — Progress Notes (Signed)
Timothy Foley, Matthew MD   HPI: 41 year old male for evaluation of lower extremity edema. No prior cardiac history. Laboratories July 2017 showed creatinine 0.72. Hemoglobin 15.3. Patient states he has had bilateral hip replacement. Over the past several months he has developed bilateral lower extremity pedal edema. He denies dyspnea, chest pain, palpitations or syncope. Also note is Lotrel was initiated last fall. He also complains of tingling in his feet bilaterally and pain in calves/cramping. Cardiology asked to evaluate.  Current Outpatient Prescriptions  Medication Sig Dispense Refill  . amLODipine-benazepril (LOTREL) 10-20 MG capsule Take 1 capsule by mouth daily.    . furosemide (LASIX) 20 MG tablet Take 20 mg by mouth.    Marland Kitchen. HYDROcodone-acetaminophen (NORCO) 7.5-325 MG tablet Take 1-2 tablets by mouth every 4 (four) hours as needed for moderate pain. 100 tablet 0   No current facility-administered medications for this visit.     Allergies  Allergen Reactions  . Erythromycin Hives    "CHILDHOOD REACTION "     Past Medical History:  Diagnosis Date  . Arthritis   . Avascular necrosis of bone of right hip (HCC)   . COPD (chronic obstructive pulmonary disease) (HCC)   . GERD (gastroesophageal reflux disease)   . Habitual alcohol use   . Headache    migraines when runs out of BP medication  . Hypertension   . Panic attacks    OCCASIONAL  . Sunburn 03/05/16   bilateral legs, lower legs bright red with sm amt swelling- feet have open draining blisters- states very much pain from his feet clear fluid  . Tobacco abuse     Past Surgical History:  Procedure Laterality Date  . PINS TO LEFT WRIST    . TONSILLECTOMY    . TOTAL HIP ARTHROPLASTY Left 12/17/2015   Procedure: LEFT TOTAL HIP ARTHROPLASTY ANTERIOR APPROACH;  Surgeon: Durene RomansMatthew Olin, MD;  Location: [facility];  Service: Orthopedics;  Laterality: Left;  . TOTAL HIP ARTHROPLASTY Right 03/10/2016   Procedure: RIGHT TOTAL HIP  ARTHROPLASTY ANTERIOR APPROACH;  Surgeon: Durene RomansMatthew Olin, MD;  Location: [facility];  Service: Orthopedics;  Laterality: Right;    Social History   Social History  . Marital status: Married    Spouse name: N/A  . Number of children: 3  . Years of education: N/A   Occupational History  . Not on file.   Social History Main Topics  . Smoking status: Current Every Day Smoker    Packs/day: 1.00    Years: 25.00    Types: Cigarettes  . Smokeless tobacco: Never Used  . Alcohol use Yes     Comment: 1 FIFITH OF LIQUOR PER DAY  . Drug use: No  . Sexual activity: Not on file   Other Topics Concern  . Not on file   Social History Narrative  . No narrative on file    Family History  Problem Relation Age of Onset  . Hypertension Mother   . Cancer Maternal Grandmother   . Heart attack Maternal Grandfather     ROS: no fevers or chills, productive cough, hemoptysis, dysphasia, odynophagia, melena, hematochezia, dysuria, hematuria, rash, seizure activity, orthopnea, PND, pedal edema, claudication. Remaining systems are negative.  Physical Exam:   Blood pressure 124/75, pulse (!) 110, height 6\' 3"  (1.905 m), weight 188 lb 4 oz (85.4 kg).  General:  Well developed/well nourished in NAD Skin warm/dry, diffuse tattoos noted. Patient not depressed No peripheral clubbing Back-normal HEENT-normal/normal eyelids Neck supple/normal carotid upstroke bilaterally; no bruits; no JVD;  no thyromegaly chest - CTA/ normal expansion CV - RRR/normal S1 and S2; no murmurs, rubs or gallops;  PMI nondisplaced Abdomen -NT/ND, no HSM, no mass, + bowel sounds, no bruit 2+ femoral pulses, no bruits Ext-trace to 1+ edema, no chords, diminished distal pulses Neuro-grossly nonfocal  ECG - sinus tachycardia at a rate of 101. Nonspecific ST changes.  A/P  1 edema-patient has minimal bilateral pedal edema but also complains of bilateral lower extremity tingling. I wonder if this may be related to amlodipine.  Discontinue Lotrel. Continue benazepril. Check echocardiogram for LV function. He is describing tingling and pain in his lower extremities and distal pulses appear to be diminished. Schedule ABIs with Doppler. I have asked him to keep his feet elevated at night. Note he has had bilateral hip replacement and this could be contributing to his lower extremity edema as well.  2 hypertension-given we are discontinuing amlodipine I will add Toprol 50 mg daily.  3 tobacco abuse-patient counseled on discontinuing.  4 alcohol abuse-patient counseled on decreasing.      Olga Millers, MD

## 2016-09-11 ENCOUNTER — Ambulatory Visit (INDEPENDENT_AMBULATORY_CARE_PROVIDER_SITE_OTHER): Payer: Medicaid Other | Admitting: Cardiology

## 2016-09-11 ENCOUNTER — Encounter: Payer: Self-pay | Admitting: Cardiology

## 2016-09-11 VITALS — BP 124/75 | HR 110 | Ht 75.0 in | Wt 188.2 lb

## 2016-09-11 DIAGNOSIS — I739 Peripheral vascular disease, unspecified: Secondary | ICD-10-CM | POA: Diagnosis not present

## 2016-09-11 DIAGNOSIS — R609 Edema, unspecified: Secondary | ICD-10-CM

## 2016-09-11 MED ORDER — BENAZEPRIL HCL 20 MG PO TABS: 20.0000 mg | ORAL_TABLET | Freq: Every day | ORAL | 3 refills | Status: DC

## 2016-09-11 MED ORDER — METOPROLOL SUCCINATE ER 50 MG PO TB24: 50.0000 mg | ORAL_TABLET | Freq: Every day | ORAL | 3 refills | Status: DC

## 2016-09-11 NOTE — Patient Instructions (Signed)
Medication Instructions:   STOP LOTREL  START BENAZEPRIL 20 MG ONCE DAILY  START METOPROLOL SUCC ER 50 MG ONCE DAILY AT BEDTIME  Testing/Procedures:  Your physician has requested that you have an echocardiogram. Echocardiography is a painless test that uses sound waves to create images of your heart. It provides your doctor with information about the size and shape of your heart and how well your heart's chambers and valves are working. This procedure takes approximately one hour. There are no restrictions for this procedure.   Your physician has requested that you have a lower extremity arterial duplex. During this test, ultrasound are used to evaluate arterial blood flow in the legs. Allow one hour for this exam. There are no restrictions or special instructions.   Follow-Up:  Your physician recommends that you schedule a follow-up appointment in: 8 WEEKS WITH DR Jens SomRENSHAW

## 2016-09-22 ENCOUNTER — Other Ambulatory Visit: Payer: Self-pay | Admitting: Cardiology

## 2016-09-22 DIAGNOSIS — I739 Peripheral vascular disease, unspecified: Secondary | ICD-10-CM

## 2016-10-08 ENCOUNTER — Inpatient Hospital Stay (HOSPITAL_COMMUNITY): Admission: RE | Admit: 2016-10-08 | Payer: Medicaid Other | Source: Ambulatory Visit

## 2016-10-08 ENCOUNTER — Other Ambulatory Visit (HOSPITAL_COMMUNITY): Payer: Medicaid Other

## 2016-10-29 ENCOUNTER — Inpatient Hospital Stay (HOSPITAL_COMMUNITY): Admission: RE | Admit: 2016-10-29 | Payer: Medicaid Other | Source: Ambulatory Visit

## 2016-10-29 ENCOUNTER — Other Ambulatory Visit (HOSPITAL_COMMUNITY): Payer: Medicaid Other

## 2016-11-05 ENCOUNTER — Ambulatory Visit: Payer: Medicaid Other | Admitting: Cardiology

## 2016-11-25 ENCOUNTER — Encounter: Payer: Self-pay | Admitting: Cardiology

## 2016-12-11 ENCOUNTER — Ambulatory Visit (HOSPITAL_COMMUNITY)
Admission: RE | Admit: 2016-12-11 | Discharge: 2016-12-11 | Disposition: A | Discharge status: Home / Self Care | Payer: Medicaid Other | Source: Ambulatory Visit | Attending: Cardiology | Admitting: Cardiology

## 2016-12-11 DIAGNOSIS — Z720 Tobacco use: Secondary | ICD-10-CM | POA: Insufficient documentation

## 2016-12-11 DIAGNOSIS — I10 Essential (primary) hypertension: Secondary | ICD-10-CM | POA: Diagnosis not present

## 2016-12-11 DIAGNOSIS — I739 Peripheral vascular disease, unspecified: Secondary | ICD-10-CM | POA: Insufficient documentation

## 2016-12-24 ENCOUNTER — Telehealth: Payer: Self-pay | Admitting: Cardiology

## 2016-12-24 DIAGNOSIS — G629 Polyneuropathy, unspecified: Secondary | ICD-10-CM

## 2016-12-24 NOTE — Telephone Encounter (Signed)
Spoke with pt, Aware of dr Ludwig Clarks recommendations. Referral placed for neuro

## 2016-12-24 NOTE — Telephone Encounter (Signed)
Returned call to patient-reports the pain and burning sensation in his lower back and legs has continued to get worse to the point he cannot sleep.  Reports the burning/pain starts in his lower back and "shoots" all the way down both legs.  Requesting a referral to a neurologist as he thinks this may be a nerve and related to prior hip replacement.  States since the medication changes made in January (stopped amlodipine, started metoprolol and lotensin)-the pain has become worse but the swelling went away in 2 days and has continued to not have a problem with edema.  Wondering if he still needs to have echo tomorrow if it was possibly medication related.     Advised to keep scheduled as of now- but would verify with Dr. Jens Som as well as approval for a referral.    Patient aware and verbalized understanding.

## 2016-12-24 NOTE — Telephone Encounter (Signed)
Patient calling, states that he is having issues with his lower back and legs. Patient states that his lower back and legs "feel like they are on fire." Patient was advised that he may need to see a neurologist and would like to know if he can get a referral to see a neurologist. Please call to advise,thanks.

## 2016-12-24 NOTE — Telephone Encounter (Signed)
Would complete echo; ok for neuro eval Timothy Foley

## 2016-12-25 ENCOUNTER — Other Ambulatory Visit: Payer: Self-pay

## 2016-12-25 ENCOUNTER — Ambulatory Visit (HOSPITAL_COMMUNITY): Payer: Medicaid Other | Attending: Cardiology

## 2016-12-25 DIAGNOSIS — I119 Hypertensive heart disease without heart failure: Secondary | ICD-10-CM | POA: Insufficient documentation

## 2016-12-25 DIAGNOSIS — J449 Chronic obstructive pulmonary disease, unspecified: Secondary | ICD-10-CM | POA: Diagnosis not present

## 2016-12-25 DIAGNOSIS — Z720 Tobacco use: Secondary | ICD-10-CM | POA: Insufficient documentation

## 2016-12-25 DIAGNOSIS — R609 Edema, unspecified: Secondary | ICD-10-CM

## 2016-12-25 LAB — ECHOCARDIOGRAM COMPLETE
E decel time: 254 msec
E/e' ratio: 7.46
FS: 29 % (ref 28–44)
IVS/LV PW RATIO, ED: 0.93
LA ID, A-P, ES: 30 mm
LA vol A4C: 28.3 ml
LADIAMINDEX: 1.4 cm/m2
LAVOL: 35.4 mL
LAVOLIN: 16.5 mL/m2
LEFT ATRIUM END SYS DIAM: 30 mm
LV E/e'average: 7.46
LVEEMED: 7.46
LVELAT: 9.36 cm/s
LVOT area: 3.8 cm2
LVOT diameter: 22 mm
Lateral S' vel: 13.3 cm/s
MV Dec: 254
MV pk E vel: 69.8 m/s
MVPKAVEL: 76.6 m/s
P 1/2 time: 74 ms
PW: 14 mm — AB (ref 0.6–1.1)
TDI e' lateral: 9.36
TDI e' medial: 8.05

## 2016-12-29 ENCOUNTER — Encounter: Payer: Self-pay | Admitting: Neurology

## 2016-12-29 ENCOUNTER — Telehealth: Payer: Self-pay | Admitting: Cardiology

## 2016-12-29 NOTE — Telephone Encounter (Signed)
Called neurology and gave them the patient name and birthday.  They will take this referral off of the work cue and call the patient with an appointment.

## 2017-02-03 ENCOUNTER — Encounter: Payer: Self-pay | Admitting: Neurology

## 2017-03-05 ENCOUNTER — Ambulatory Visit (INDEPENDENT_AMBULATORY_CARE_PROVIDER_SITE_OTHER): Payer: Medicaid Other | Admitting: Neurology

## 2017-03-05 ENCOUNTER — Encounter: Payer: Self-pay | Admitting: Neurology

## 2017-03-05 ENCOUNTER — Other Ambulatory Visit (INDEPENDENT_AMBULATORY_CARE_PROVIDER_SITE_OTHER): Payer: Medicaid Other

## 2017-03-05 VITALS — BP 140/90 | HR 86 | Ht 75.0 in | Wt 189.6 lb

## 2017-03-05 DIAGNOSIS — G621 Alcoholic polyneuropathy: Secondary | ICD-10-CM | POA: Insufficient documentation

## 2017-03-05 DIAGNOSIS — Z131 Encounter for screening for diabetes mellitus: Secondary | ICD-10-CM

## 2017-03-05 LAB — FOLATE: FOLATE: 2.7 ng/mL — AB (ref >5.9)

## 2017-03-05 LAB — TSH: TSH: 1.27 u[IU]/mL (ref 0.35–4.50)

## 2017-03-05 LAB — C-REACTIVE PROTEIN: CRP: 0.3 mg/dL — ABNORMAL LOW (ref 0.5–20.0)

## 2017-03-05 LAB — HEMOGLOBIN A1C: HEMOGLOBIN A1C: 4.9 % (ref 4.6–6.5)

## 2017-03-05 LAB — SEDIMENTATION RATE: Sed Rate: 9 mm/hr (ref 0–15)

## 2017-03-05 MED ORDER — NORTRIPTYLINE HCL 10 MG PO CAPS: ORAL_CAPSULE | ORAL | 5 refills | Status: AC

## 2017-03-05 NOTE — Progress Notes (Signed)
[facility] Clinic Note - Initial Visit   Date: 03/05/17  Timothy Foley MRN: [id number] DOB: September 14, 1975   Dear Dr. Stanford Breed:  Thank you for your kind referral of HUDSON MAJKOWSKI for consultation of bilateral feet pain. Although his history is well known to you, please allow [place] to reiterate it for the purpose of our medical record. The patient was accompanied to the clinic by mother who also provides collateral information.     History of Present Illness: Timothy Foley is a 41 y.o. right-handed Caucasian male with bilateral avascular necrosis s/p bilateral hip replacement (2017), CODP, current tobacco use, GERD, hypertension, and alcohol use presenting for evaluation of bilateral leg pain.    Starting in summer 2017, he began having numbness at night and burning sensation over the toes.  Over the past year, his burning pain has gradually worsen and involves the dorsum of the feet, just below the ankles.  He feels as if there is firey and ice-cold water over his feet.  Symptoms are constant and there is nothing that triggers or alleviates his pain.  He did not tolerate gabapentin due to GI side effects.  He is currently taking Lyrica 180m BID which provides 50% relief.  He has tried OTC arthritis creams which did not help.  He was evaluated by Dr. CStanford Breed his cardiologist, for leg edema and mentioned his feet pain during this visit.  He had arterial studies of the leg which was normal and referred to see me for neuropathy.  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  Part of his duties includes operating a loading truck and he uses feet pedals and began noticing difficulty controlling the pressure on the pedals. He endorses weakness of the legs and imbalance.  He walk unassisted and has not suffered any falls.   He has no history of diabetes or family history of neuropathy.  He  drinks half fifth whiskey nightly for the past two years.  He started drinking around the age of 89and was drinking 2-4 beers nightly for many years, however about  2 years ago, he increased his intake to half fifth whiskey nightly, when his hips starting hurting.    Out-side paper records, electronic medical record, and images have been reviewed where available and summarized as:  Vascular studies of the legs 12/15/2016:  Normal    Past Medical History:  Diagnosis Date  . Arthritis   . Avascular necrosis of bone of right hip ([facility]   . COPD (chronic obstructive pulmonary disease) ([facility]   . GERD (gastroesophageal reflux disease)   . Habitual alcohol use   . Headache    migraines when runs out of BP medication  . Hypertension   . Panic attacks    OCCASIONAL  . Sunburn 03/05/16   bilateral legs, lower legs bright red with sm amt swelling- feet have open draining blisters- states very much pain from his feet clear fluid  . Tobacco abuse     Past Surgical History:  Procedure Laterality Date  . PINS TO LEFT WRIST    . TONSILLECTOMY    . TOTAL HIP ARTHROPLASTY Left 12/17/2015   Procedure: LEFT TOTAL HIP ARTHROPLASTY ANTERIOR APPROACH;  Surgeon: MParalee Cancel MD;  Location: [facility];  Service: Orthopedics;  Laterality: Left;  . TOTAL HIP ARTHROPLASTY Right 03/10/2016   Procedure: RIGHT TOTAL HIP ARTHROPLASTY ANTERIOR  APPROACH;  Surgeon: Paralee Cancel, MD;  Location: [facility];  Service: Orthopedics;  Laterality: Right;     Medications:  Outpatient Encounter Prescriptions as of 03/05/2017  Medication Sig  . benazepril (LOTENSIN) 20 MG tablet Take 1 tablet (20 mg total) by mouth daily.  . pregabalin (LYRICA) 75 MG capsule Take 150 mg by mouth 2 (two) times daily.  . metoprolol succinate (TOPROL-XL) 50 MG 24 hr tablet Take 1 tablet (50 mg total) by mouth daily. Take with or immediately following a meal.  . nortriptyline (PAMELOR) 10 MG capsule Start nortriptyline 21m at bedtime for 2 week, then  increase to 2 tablet at bedtime  . [DISCONTINUED] furosemide (LASIX) 20 MG tablet Take 20 mg by mouth.  . [DISCONTINUED] HYDROcodone-acetaminophen (NORCO) 7.5-325 MG tablet Take 1-2 tablets by mouth every 4 (four) hours as needed for moderate pain.   No facility-administered encounter medications on file as of 03/05/2017.      Allergies:  Allergies  Allergen Reactions  . Dilantin  [Phenytoin Sodium Extended]   . Tetracycline   . Erythromycin Hives    "CHILDHOOD REACTION "    Family History: Family History  Problem Relation Age of Onset  . Hypertension Mother   . Cancer Father   . Cancer Maternal Grandmother   . Heart attack Maternal Grandfather     Social History: Social History  Substance Use Topics  . Smoking status: Current Every Day Smoker    Packs/day: 1.00    Years: 25.00    Types: Cigarettes  . Smokeless tobacco: Never Used  . Alcohol use Yes     Comment: 1 FIFITH OF LIQUOR PER DAY   Social History   Social History Narrative   Lives with mom in a one story home.  Married with 3 daughters.     Works cEstate agent     Education: 9th grade.     Review of Systems:  CONSTITUTIONAL: No fevers, chills, night sweats, or weight loss.   EYES: No visual changes or eye pain ENT: No hearing changes.  No history of nose bleeds.   RESPIRATORY: No cough, wheezing and shortness of breath.   CARDIOVASCULAR: Negative for chest pain, and palpitations.   GI: Negative for abdominal discomfort, blood in stools or black stools.  No recent change in bowel habits.   GU:  No history of incontinence.   MUSCLOSKELETAL: No history of joint pain or swelling.  No myalgias.   SKIN: Negative for lesions, rash, and itching.   HEMATOLOGY/ONCOLOGY: Negative for prolonged bleeding, bruising easily, and swollen nodes.  No history of cancer.   ENDOCRINE: Negative for cold or heat intolerance, polydipsia or goiter.   PSYCH:  No depression or anxiety symptoms.   NEURO: As Above.   Vital  Signs:  BP 140/90   Pulse 86   Ht 6' 3"  (1.905 m)   Wt 189 lb 9 oz (86 kg)   SpO2 98%   BMI 23.69 kg/m    General Medical Exam:   General:  Well appearing, comfortable.   Eyes/ENT: see cranial nerve examination.   Neck: No masses appreciated.  Full range of motion without tenderness.  No carotid bruits. Respiratory:  Clear to auscultation, good air entry bilaterally.   Cardiac:  Regular rate and rhythm, no murmur.   Extremities:  No deformities, edema, or skin discoloration. Heavily tatooed arms. Skin:  No rashes or lesions.  Neurological Exam: MENTAL STATUS including orientation to time, place, person, recent and remote memory, attention span and concentration,  language, and fund of knowledge is normal.  Speech is not dysarthric.  CRANIAL NERVES: II:  No visual field defects.  Unremarkable fundi.   III-IV-VI: Pupils equal round and reactive to light.  Normal conjugate, extra-ocular eye movements in all directions of gaze.  No nystagmus.  No ptosis.   V:  Normal facial sensation.    VII:  Normal facial symmetry and movements.    VIII:  Normal hearing and vestibular function.   IX-X:  Normal palatal movement.   XI:  Normal shoulder shrug and head rotation.   XII:  Normal tongue strength and range of motion, no deviation or fasciculation.  MOTOR:  There is muscle atrophy of the intrinsic feet muscles bilaterally.  No fasciculations or abnormal movements.  No pronator drift.  Tone is normal.    Right Upper Extremity:    Left Upper Extremity:    Deltoid  5/5   Deltoid  5/5   Biceps  5/5   Biceps  5/5   Triceps  5/5   Triceps  5/5   Wrist extensors  5/5   Wrist extensors  5/5   Wrist flexors  5/5   Wrist flexors  5/5   Finger extensors  5/5   Finger extensors  5/5   Finger flexors  5/5   Finger flexors  5/5   Dorsal interossei  5/5   Dorsal interossei  5/5   Abductor pollicis  5/5   Abductor pollicis  5/5   Tone (Ashworth scale)  0  Tone (Ashworth scale)  0   Right Lower  Extremity:    Left Lower Extremity:    Hip flexors  5/5   Hip flexors  5/5   Hip extensors  5/5   Hip extensors  5/5   Knee flexors  5/5   Knee flexors  5/5   Knee extensors  5/5   Knee extensors  5/5   Dorsiflexors  4/5   Dorsiflexors  4/5   Plantarflexors  4/5   Plantarflexors  4/5   Toe extensors  4/5   Toe extensors  4/5   Toe flexors  4/5   Toe flexors  4/5   Tone (Ashworth scale)  0  Tone (Ashworth scale)  0   MSRs:  Right                                                                 Left brachioradialis 2+  brachioradialis 2+  biceps 2+  biceps 2+  triceps 2+  triceps 2+  patellar 2+  patellar 2+  ankle jerk 0  ankle jerk 0  Hoffman no  Hoffman no  plantar response down  plantar response down   SENSORY: Vibration reduced to 75% at the knees, 25% at the ankles, and absent at the great toe.  Pin prick is reduced mid-calf distally into the feet in a gradient patern.  Temperature is absent over the dorsum of the feet. Romberg's sign absent.   COORDINATION/GAIT: Normal finger-to- nose-finger.  Intact rapid alternating movements bilaterally.  Able to rise from a chair without using arms.  Gait slightly wide-based, stable.  There is no steppage.  He is able to walk on heels, unable to walk on toes.  He is unsteady with tandem gait, but still able  to perform.   IMPRESSION: Mr. Stead is a very pleasant 41 year-old gentleman referred for evaluation of bilateral burning feet paresthesias.  His neurological examination shows a distal predominant small and large fiber peripheral neuropathy, with both sensory and motor deficits.   I had extensive discussion with the patient regarding the pathogenesis, etiology, management, and natural course of neuropathy.  The most likely etiology for his neuropathy is neurotoxic effects from chronic alcohol consumption.  I counselled him at length about the importance of cutting back and stopping alcohol all together to prevent worsening neuropathy.   Neuropathy tends to be slowly progressive, especially if a underlying etiology is not managed.  To be complete, I will also test for secondary causes of neuropathy.  Unfortunately, he is having severe pain which is alleviated by 50% with Lyrica 160m BID, but cost prohibitive to continue.  He did not tolerate gabapentin due to GI side effects.  I will start him on low dose of nortriptyline.  QTc was checked and is borderline 448, so will need to follow this.   PLAN/RECOMMENDATIONS:  1.  Check ESR, CRP, vitamin B12, vitamin B1, folate, copper, SPEP with IFE, SSA/B, TSH, HbA1c 2.  NCS/EMG of the legs 3.  Start nortriptyline 131mat bedtime for 2 week, then increase to 2 tablet at bedtime.  Common side effects discussed 4.  Continue Lyrica 15028mwice daily for now, due to expense, will try to taper this going forward 5.  Strongly advised him to stop alcohol, also counseled on tobacco cessation 6.  Check EKG at next visit  Return to clinic in 4 months.   The duration of this appointment visit was 60 minutes of face-to-face time with the patient.  Greater than 50% of this time was spent in counseling, explanation of diagnosis, planning of further management, and coordination of care.   Thank you for allowing me to participate in patient's care.  If I can answer any additional questions, I would be pleased to do so.    Sincerely,    Donika K. PatPosey ProntoO

## 2017-03-05 NOTE — Patient Instructions (Addendum)
1.  Check labs 2.  NCS/EMG of the leg 3.  Start nortriptyline 10mg  at bedtime for two weeks, then increase to 2 tablets at bedtime 4.  Strongly advised to cut back alcohol and try to quit all together   Return to clinic in 4 months

## 2017-03-08 LAB — SJOGREN'S SYNDROME ANTIBODS(SSA + SSB)
SSA (RO) (ENA) ANTIBODY, IGG: NEGATIVE
SSB (La) (ENA) Antibody, IgG: <1

## 2017-03-08 LAB — PROTEIN ELECTROPHORESIS, SERUM
ALBUMIN ELP: 4 g/dL (ref 3.8–4.8)
ALPHA-1-GLOBULIN: 0.3 g/dL (ref 0.2–0.3)
ALPHA-2-GLOBULIN: 0.7 g/dL (ref 0.5–0.9)
BETA GLOBULIN: 0.4 g/dL (ref 0.4–0.6)
Beta 2: 0.3 g/dL (ref 0.2–0.5)
Gamma Globulin: 1.1 g/dL (ref 0.8–1.7)
Total Protein, Serum Electrophoresis: 6.8 g/dL (ref 6.1–8.1)

## 2017-03-08 LAB — IMMUNOFIXATION ELECTROPHORESIS
IGA: 164 mg/dL (ref 81–463)
IGG (IMMUNOGLOBIN G), SERUM: 1096 mg/dL (ref 694–1618)
IGM, SERUM: 219 mg/dL (ref 48–271)

## 2017-03-09 LAB — COPPER, SERUM: Copper: 117 ug/dL (ref 70–175)

## 2017-03-10 LAB — VITAMIN B1: VITAMIN B1 (THIAMINE): 7 nmol/L — AB (ref 8–30)

## 2017-03-12 ENCOUNTER — Telehealth: Payer: Self-pay | Admitting: *Deleted

## 2017-03-12 NOTE — Telephone Encounter (Signed)
Left message for patient to call me back. 

## 2017-03-12 NOTE — Telephone Encounter (Signed)
-----   Message from [place] Chardonika K Patel, DO sent at 03/10/2017  9:12 AM EDT ----- Please inform patient that his folate is very low and he needs to start folic acid 1mg  daily.  There is no evidence of diabetes or thyroid disease.  We are still waiting on his vitamin B1 and B12 as well as a few more labs, and will contact him when these result.  Please f/u on his vitamin B12 - order looks "active" but not "in process".  Thanks.

## 2017-03-12 NOTE — Telephone Encounter (Signed)
-----   Message from Octaviano Battyebecca S Tat, DO sent at 03/10/2017  1:43 PM EDT ----- Let pt know that his thiamine is low and should take OTC supplement of thiamine 100mg  daily

## 2017-03-15 ENCOUNTER — Telehealth: Payer: Self-pay | Admitting: *Deleted

## 2017-03-15 NOTE — Telephone Encounter (Signed)
Patient's mom given results and instructions.

## 2017-03-15 NOTE — Telephone Encounter (Signed)
-----   Message from Donika K Patel, DO sent at 03/10/2017  9:12 AM EDT ----- Please inform patient that his folate is very low and he needs to start folic acid 1mg daily.  There is no evidence of diabetes or thyroid disease.  We are still waiting on his vitamin B1 and B12 as well as a few more labs, and will contact him when these result.  Please f/u on his vitamin B12 - order looks "active" but not "in process".  Thanks. 

## 2017-03-15 NOTE — Telephone Encounter (Signed)
-----   Message from Rebecca S Tat, DO sent at 03/10/2017  1:43 PM EDT ----- Let pt know that his thiamine is low and should take OTC supplement of thiamine 100mg daily 

## 2017-03-15 NOTE — Telephone Encounter (Signed)
Patient's mom given results and instructions.  His B12 did not get done but he is coming back on August 7th so we can have it done that day.

## 2017-04-06 ENCOUNTER — Telehealth: Payer: Self-pay | Admitting: Neurology

## 2017-04-06 ENCOUNTER — Ambulatory Visit (INDEPENDENT_AMBULATORY_CARE_PROVIDER_SITE_OTHER): Payer: Medicaid Other | Admitting: Neurology

## 2017-04-06 ENCOUNTER — Other Ambulatory Visit (INDEPENDENT_AMBULATORY_CARE_PROVIDER_SITE_OTHER): Payer: Medicaid Other

## 2017-04-06 DIAGNOSIS — G621 Alcoholic polyneuropathy: Secondary | ICD-10-CM

## 2017-04-06 LAB — VITAMIN B12: VITAMIN B 12: 243 pg/mL (ref 211–911)

## 2017-04-06 NOTE — Procedures (Signed)
[facility]  [address], [address]  [place], [place] [address] Tel: [phone] Fax:  [phone] Test Date:  04/06/2017  Patient: Timothy Foley DOB: 08-28-1976 Physician: Nita Sickle, DO  Sex: Male Height: 6\' 3"  Ref Phys: Nita Sickle, DO  ID#: [id number] Temp: 37.5C Technician:    Patient Complaints: This is a 41 year-old man with chronic alcohol use referred for evaluation of bilateral feet burning sensation and weakness.  NCV & EMG Findings: Extensive electrodiagnostic testing of the right lower extremity and additional studies of the left shows:  1. Bilateral superficial peroneal and left sural sensory responses are absent. Right sural sensory response shows reduced amplitude (R4.6 V). 2. Left peroneal motor response shows reduced amplitude at the extensor digitorum brevis and is normal at the tibialis anterior. Right peroneal and bilateral tibial motor responses are within normal limits. 3. Right tibial H reflex study is within normal limits. 4. Sparse chronic motor axon loss changes are seen affecting bilateral flexor digitorum longus and medial gastrocnemius muscles, without accompanied active denervation.  Impression: The electrophysiologic findings are most consistent with a chronic sensorimotor axonal polyneuropathy affecting the lower extremities; moderate in degree electrically.   ___________________________ Nita Sickle, DO    Nerve Conduction Studies Anti Sensory Summary Table   Site NR Peak (ms) Norm Peak (ms) P-T Amp (V) Norm P-T Amp  Left Sup Peroneal Anti Sensory (Ant Lat Mall)  37.5C  12 cm NR  <4.5  >5  Right Sup Peroneal Anti Sensory (Ant Lat Mall)  37.5C  12 cm NR  <4.5  >5  Left Sural Anti Sensory (Lat Mall)  37.5C  Calf NR  <4.5  >5  Right Sural Anti Sensory (Lat Mall)  37.5C  Calf    3.7 <4.5 4.6 >5  Site 2    3.8  4.1    Motor Summary Table   Site NR Onset (ms) Norm Onset (ms) O-P Amp (mV) Norm O-P Amp Site1 Site2  Delta-0 (ms) Dist (cm) Vel (m/s) Norm Vel (m/s)  Left Peroneal Motor (Ext Dig Brev)  37.5C  Ankle    4.8 <5.5 2.0 >3 B Fib Ankle 8.8 39.0 44 >40  B Fib    13.6  1.3  Poplt B Fib 2.2 9.0 41 >40  Poplt    15.8  1.1         Right Peroneal Motor (Ext Dig Brev)  37.5C  Ankle    3.1 <5.5 3.8 >3 B Fib Ankle 8.4 42.0 50 >40  B Fib    11.5  3.8  Poplt B Fib 1.8 9.0 50 >40  Poplt    13.3  3.5         Left Peroneal TA Motor (Tib Ant)  37.5C  Fib Head    3.7 <4.0 4.0 >4 Poplit Fib Head 1.6 9.0 56 >40  Poplit    5.3  3.9         Right Peroneal TA Motor (Tib Ant)  37.5C  Fib Head    3.6 <4.0 4.8 >4 Poplit Fib Head 1.1 8.0 73 >40  Poplit    4.7  4.3         Left Tibial Motor (Abd Hall Brev)  37.5C  Ankle    4.5 <6.0 11.1 >8 Knee Ankle 10.0 41.0 41 >40  Knee    14.5  6.7         Right Tibial Motor (Abd Hall Brev)  37.5C  Ankle    5.2 <6.0 10.1 >8  Knee Ankle 8.6 40.0 47 >40  Knee    13.8  6.0          H Reflex Studies   NR H-Lat (ms) Lat Norm (ms) L-R H-Lat (ms)  Right Tibial (Gastroc)  37.5C     34.97 <35    EMG   Side Muscle Ins Act Fibs Psw Fasc Number Recrt Dur Dur. Amp Amp. Poly Poly. Comment  Right AntTibialis Nml Nml Nml Nml Nml Nml Nml Nml Nml Nml Nml Nml N/A  Right Gastroc Nml Nml Nml Nml 2- Mod-R Few 1+ Few 1+ Nml Nml N/A  Right Flex Dig Long Nml Nml Nml Nml 3- Mod-R Few 1+ Nml Nml Nml Nml N/A  Right RectFemoris Nml Nml Nml Nml Nml Nml Nml Nml Nml Nml Nml Nml N/A  Right GluteusMed Nml Nml Nml Nml Nml Nml Nml Nml Nml Nml Nml Nml N/A  Right BicepsFemS Nml Nml Nml Nml Nml Nml Nml Nml Nml Nml Nml Nml N/A  Left AntTibialis Nml Nml Nml Nml Nml Nml Nml Nml Nml Nml Nml Nml N/A  Left Gastroc Nml Nml Nml Nml 2- Mod-R Few 1+ Few 1+ Nml Nml N/A  Left Flex Dig Long Nml Nml Nml Nml 3- Mod-R Few 1+ Nml Nml Nml Nml N/A  Left RectFemoris Nml Nml Nml Nml Nml Nml Nml Nml Nml Nml Nml Nml N/A  Left GluteusMed Nml Nml Nml Nml Nml Nml Nml Nml Nml Nml Nml Nml N/A  Left BicepsFemS Nml Nml Nml Nml  Nml Nml Nml Nml Nml Nml Nml Nml N/A      Waveforms:

## 2017-04-06 NOTE — Telephone Encounter (Signed)
Results of EMG discussed with patient which showed sensory predominate axonal neuropathy.  He was encouraged to start folic acid 1mg  and thiamine 100mg  daily, as well as cut back on alcohol intake.  Donika K. Allena KatzPatel, DO

## 2017-04-09 ENCOUNTER — Telehealth: Payer: Self-pay | Admitting: *Deleted

## 2017-04-09 ENCOUNTER — Other Ambulatory Visit: Payer: Self-pay | Admitting: *Deleted

## 2017-04-09 NOTE — Telephone Encounter (Signed)
-----   Message from Glendale Chardonika K Patel, DO sent at 04/07/2017 11:20 AM EDT ----- Please inform patient that his vitamin B12 is very low-normal and with his neuropathy, I would recommend that he start injections.  Vitamin B12 1000mcg IM injection daily x 7 days, weekly x 4 weeks, then monthly thereafter x 1 year. He can get this done closer to home by his PCP or if anyone can administer it at home.  If he cannot get injections, start vitamin B12 1000mcg daily by mouth.  We can recheck the level when he comes for follow-up and be sure it is absorbed.

## 2017-04-09 NOTE — Telephone Encounter (Signed)
Patient given results and instructions.  He said that his wife could give him the injections.  Rx called in to pharmacy.

## 2017-04-21 IMAGING — DX DG HIP (WITH OR WITHOUT PELVIS) 1V PORT*L*
2 series · 3 of 3 positions shown · non-contrast
Comparison: Radiographs 09/19/2015.

CLINICAL DATA: Postop left total hip arthroplasty.

EXAM:
DG HIP (WITH OR WITHOUT PELVIS) 1V PORT LEFT

[Series 1: pelvis ap · 0.14mm/px · 2 of 2 slices shown]
[im 1/2]
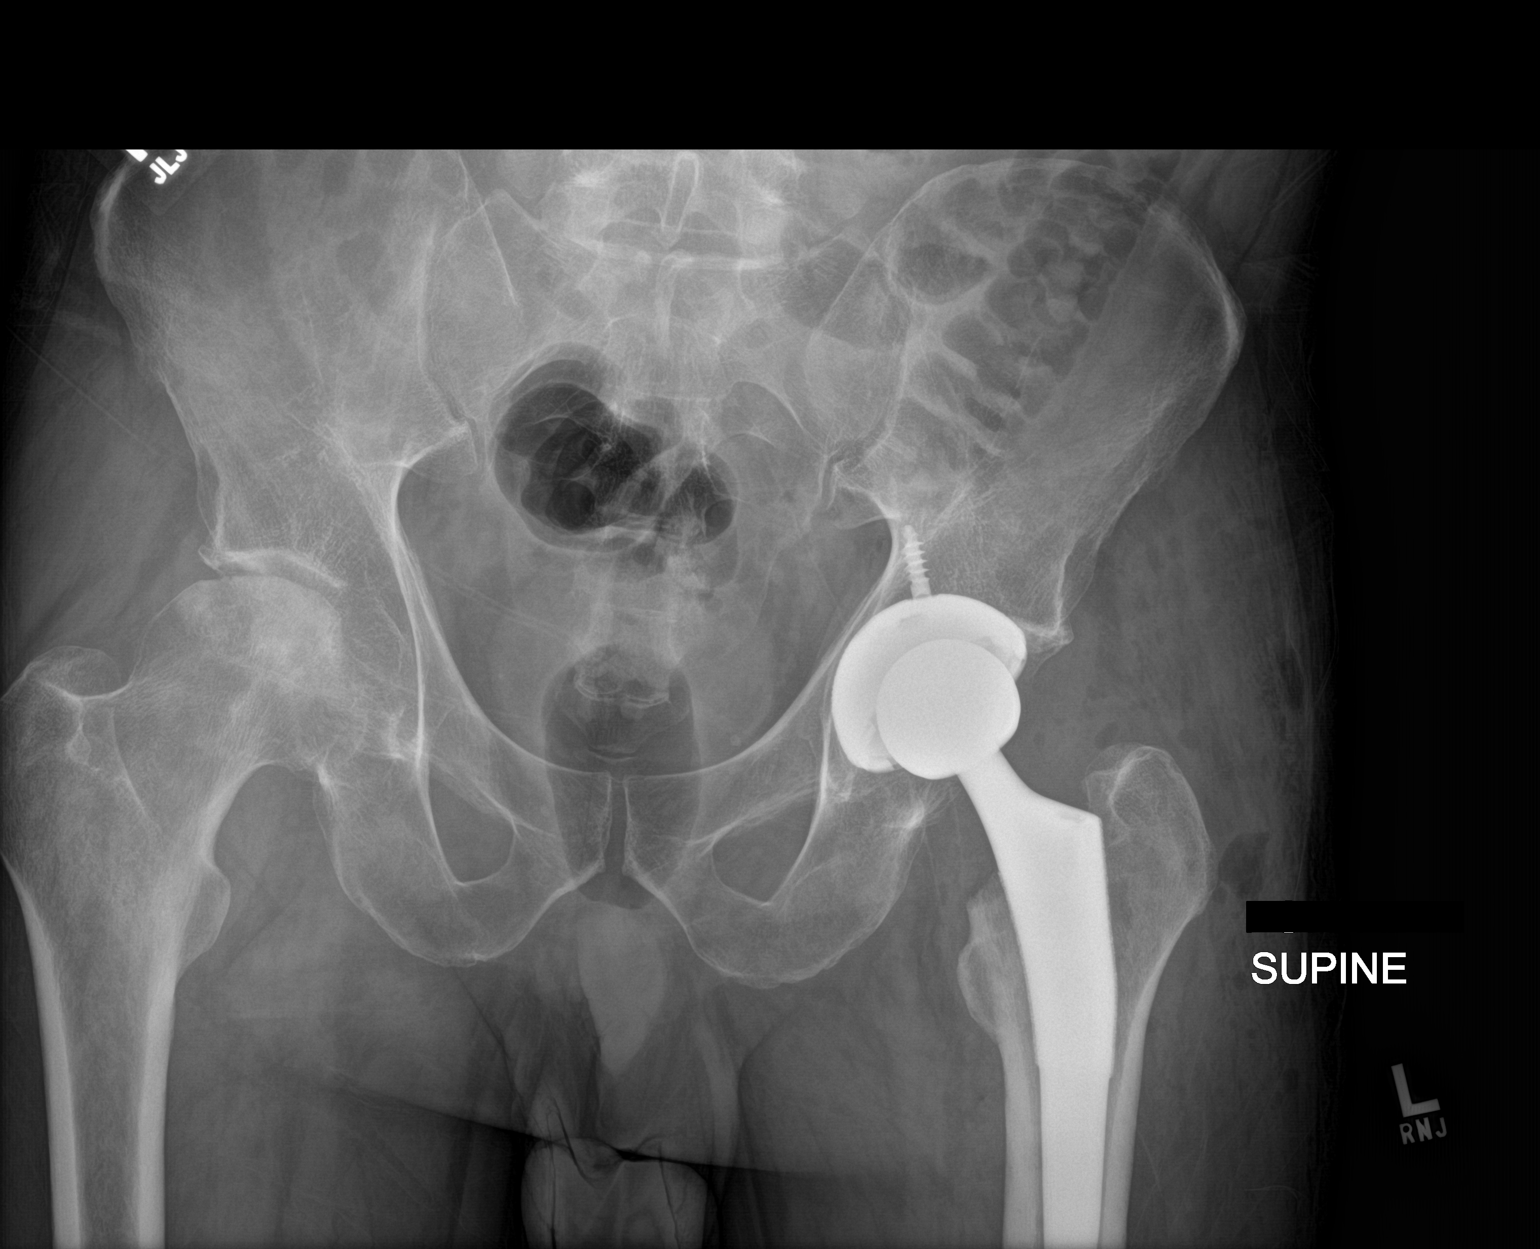
[im 2/2]
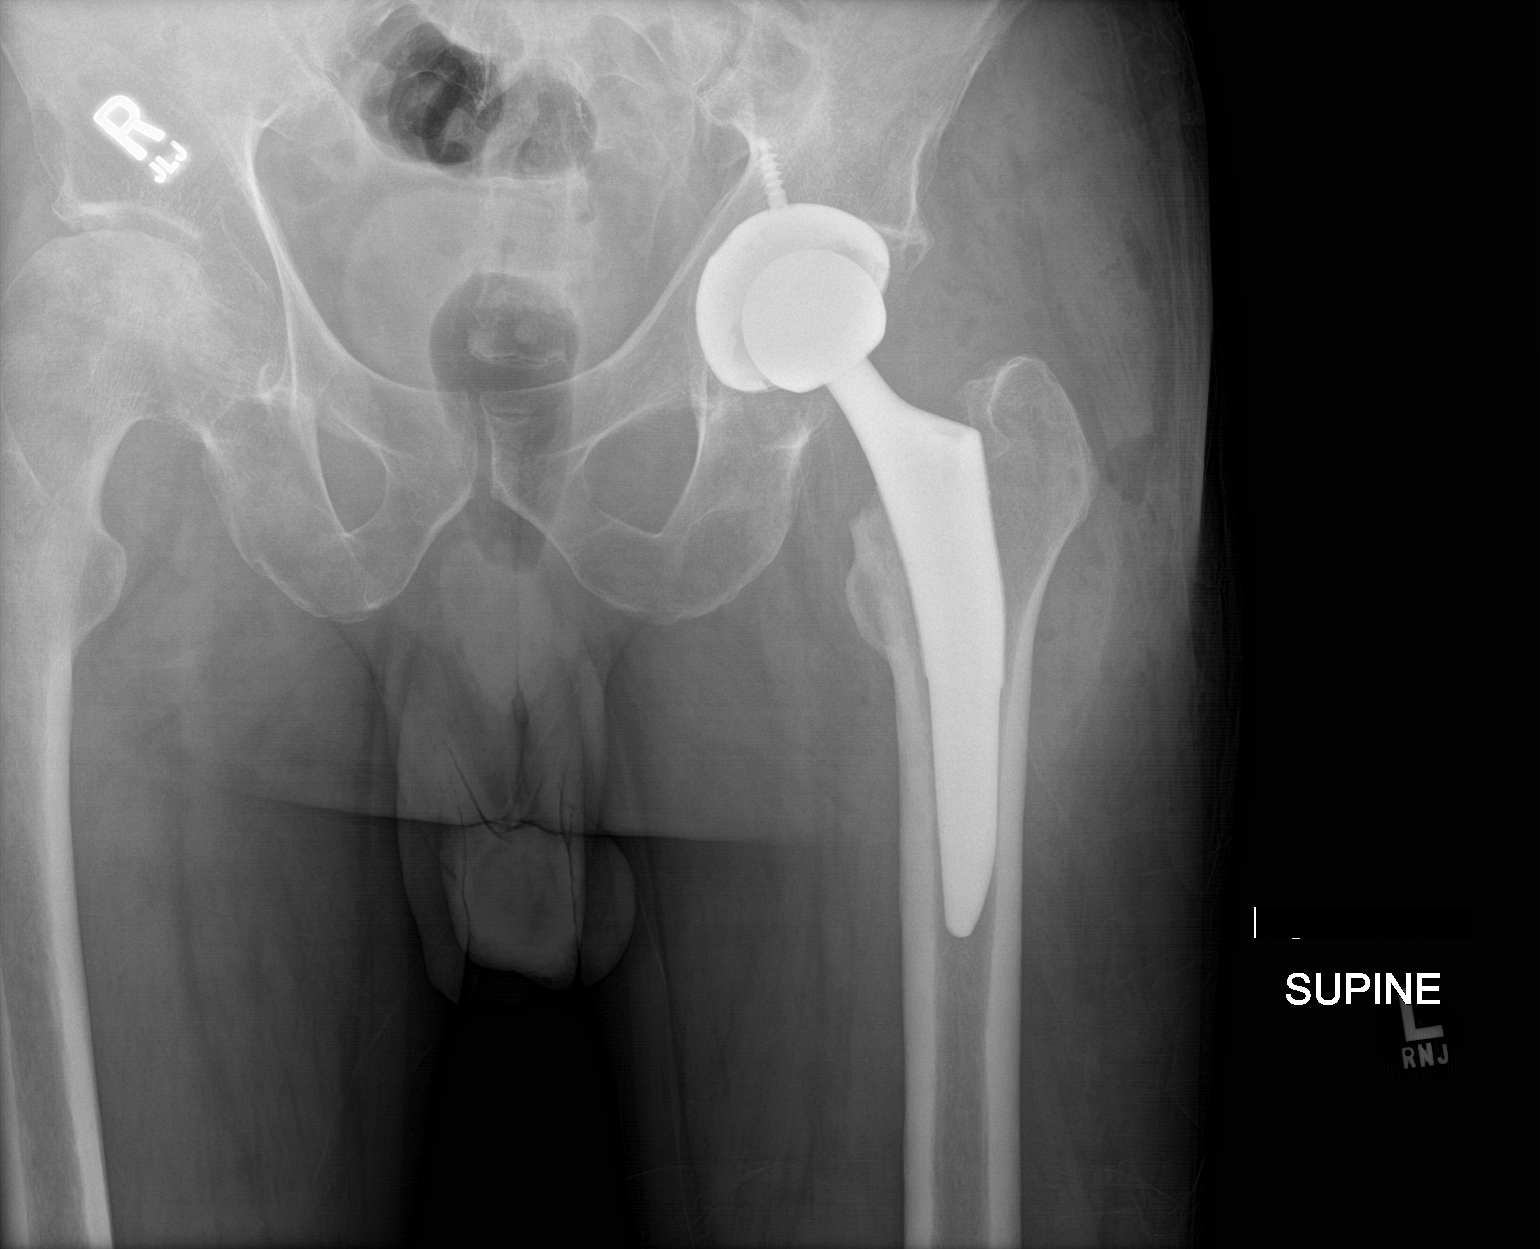

[hip frog leg]
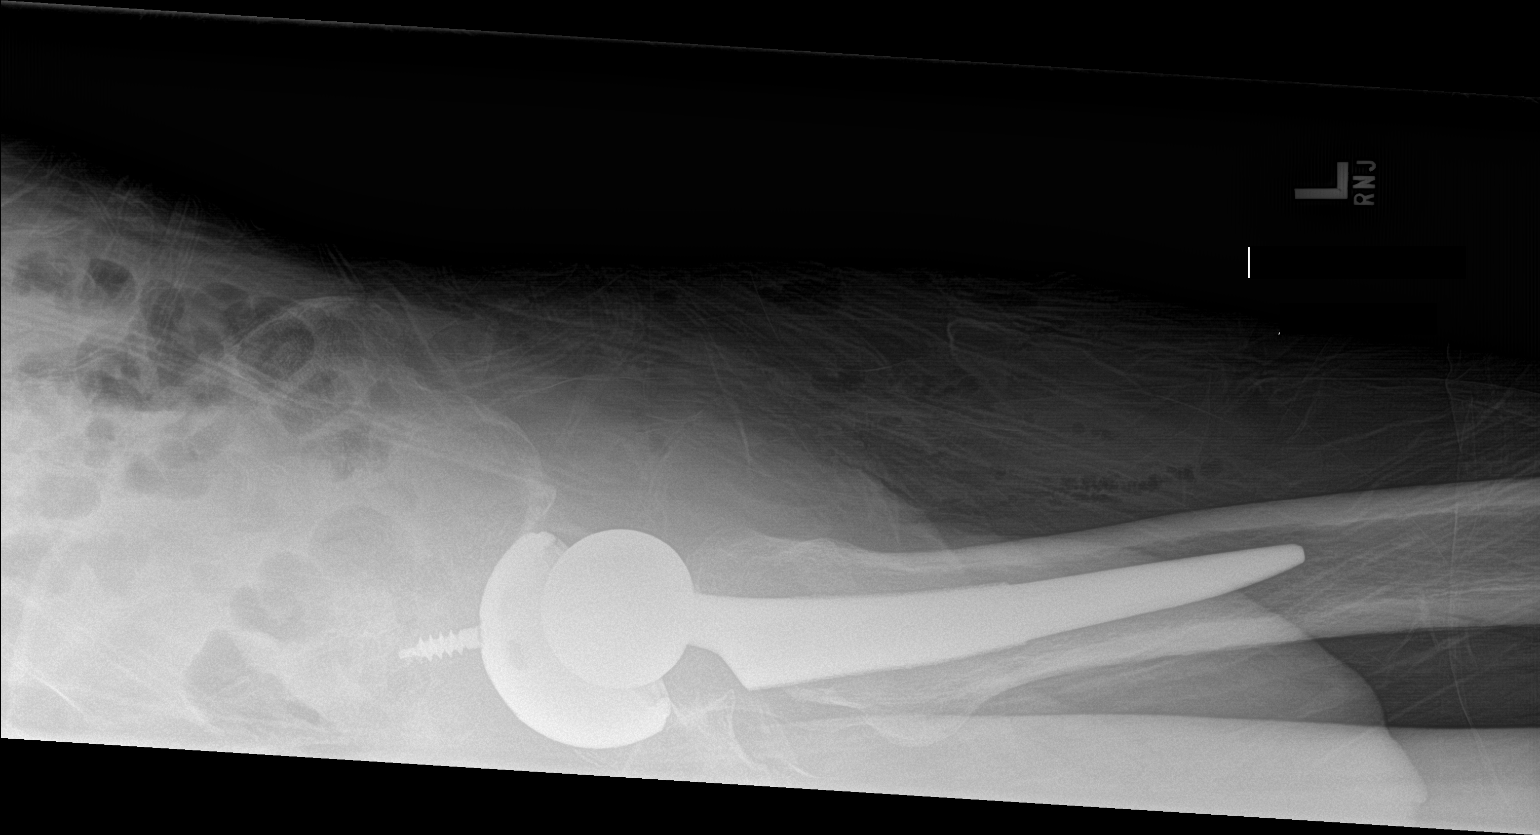

[3 of 3 positions shown; findings below may reference images not displayed]

FINDINGS: Interval left total hip arthroplasty with a screw fixed acetabular
component. The hardware is well positioned. There is no evidence of
acute fracture or dislocation. There is a small amount of gas
surrounding the left hip. Changes of chronic right femoral head
avascular necrosis again noted.
IMPRESSION: 1. No demonstrated complication following left total hip
arthroplasty.
2. Stable changes of right femoral head avascular necrosis.

## 2017-07-14 IMAGING — DX DG HIP (WITH OR WITHOUT PELVIS) 1V PORT*R*
2 series · 2 of 2 positions shown · non-contrast
Comparison: None.

CLINICAL DATA: Status post right hip replacement.

EXAM:
DG HIP (WITH OR WITHOUT PELVIS) 1V PORT RIGHT

[hip lat (1 of 2)]
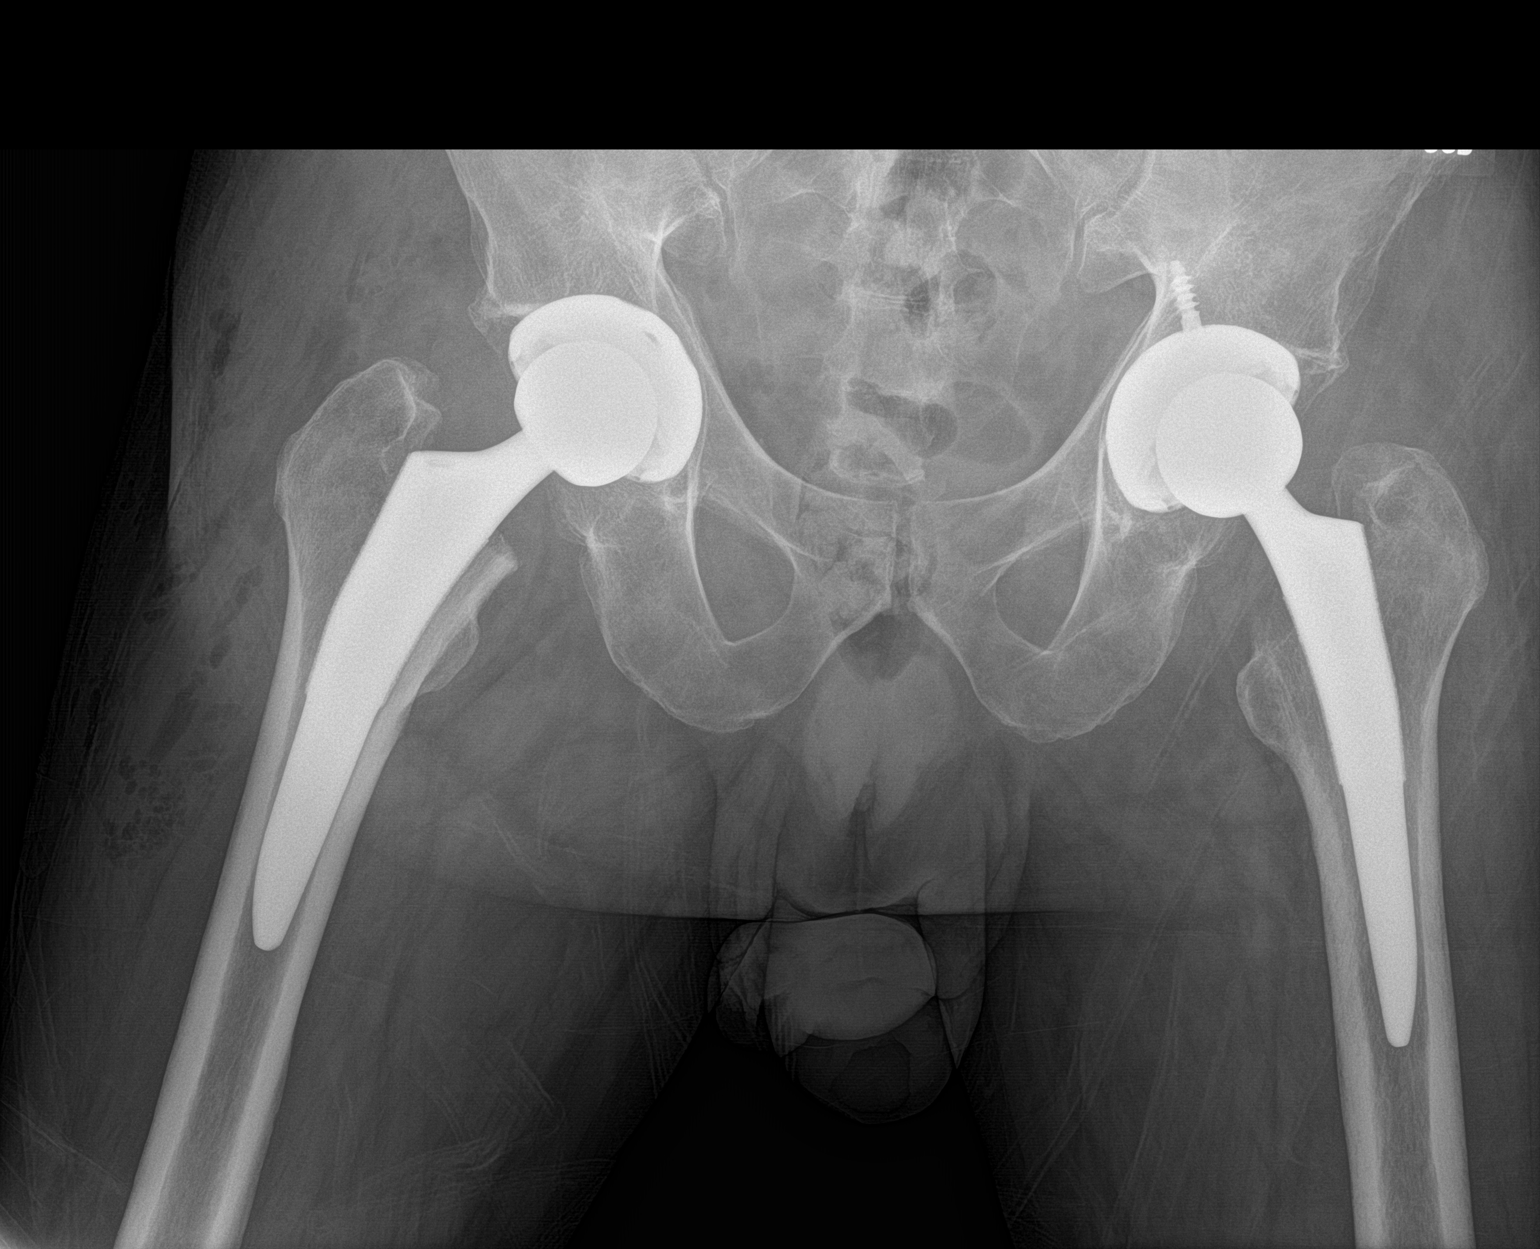

[hip lat (2 of 2)]
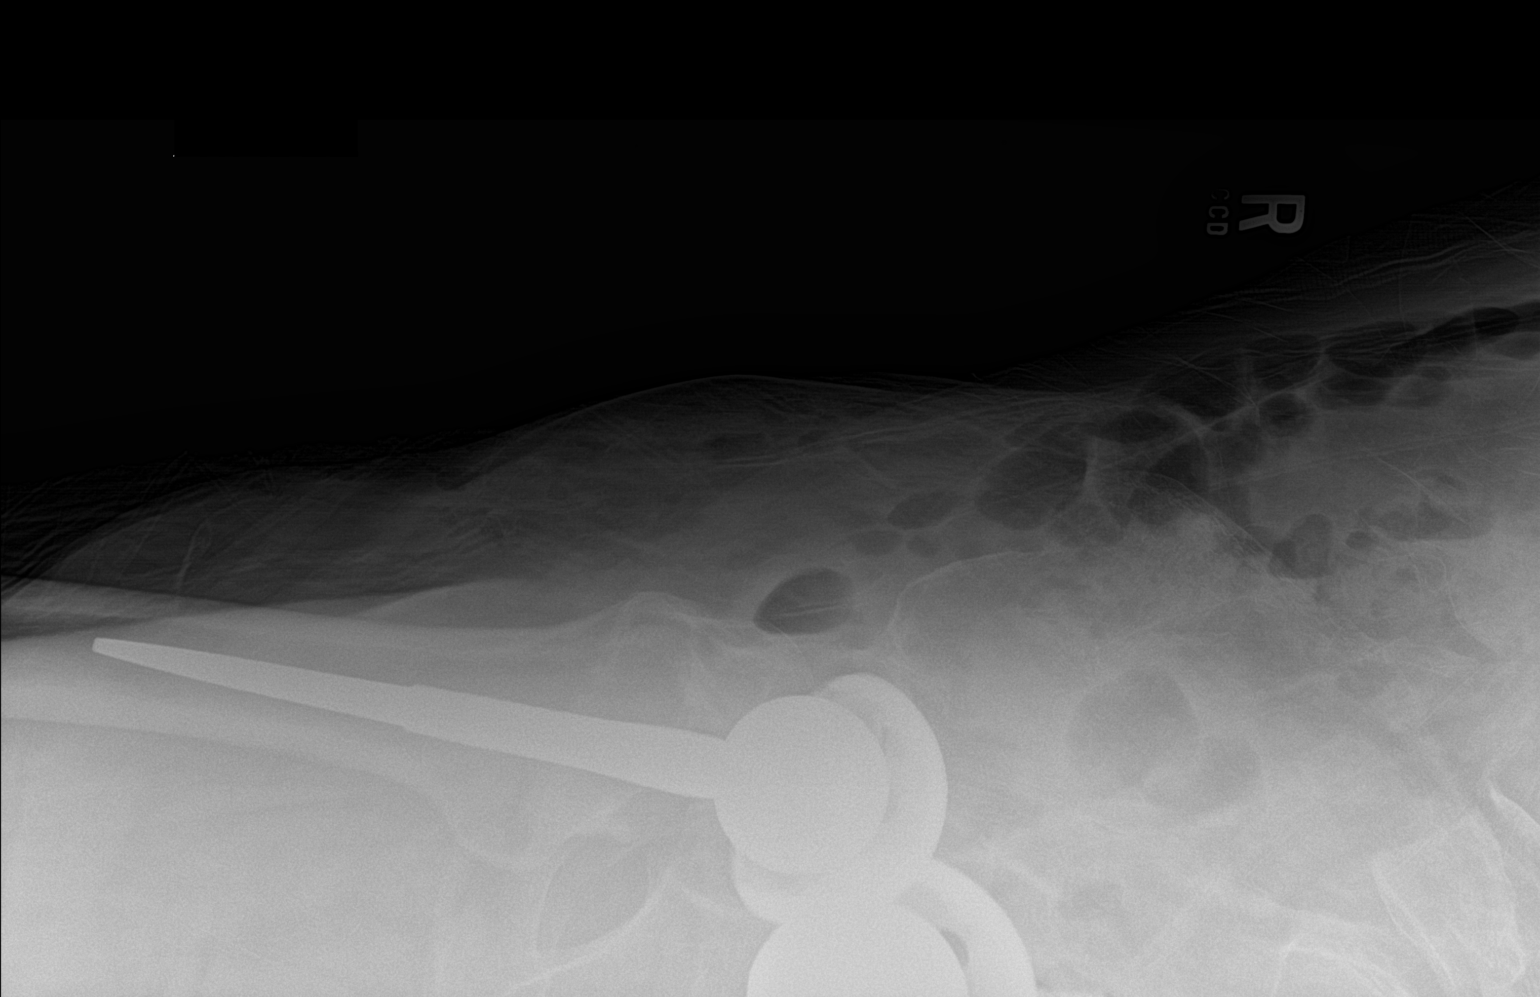

[2 of 2 positions shown; findings below may reference images not displayed]

FINDINGS: A left total hip prosthesis and right hip bipolar prosthesis are
demonstrated. These are in satisfactory position and alignment. No
fracture or dislocation seen.
IMPRESSION: Satisfactory postoperative appearance of a right hip bipolar
prosthesis.

## 2017-07-21 ENCOUNTER — Ambulatory Visit: Payer: Medicaid Other | Admitting: Neurology

## 2017-09-12 ENCOUNTER — Other Ambulatory Visit: Payer: Self-pay | Admitting: Cardiology

## 2017-09-13 NOTE — Telephone Encounter (Signed)
Rx(s) sent to pharmacy electronically.  

## 2017-09-20 ENCOUNTER — Telehealth: Payer: Self-pay | Admitting: Neurology

## 2017-09-20 NOTE — Telephone Encounter (Signed)
Patient's mom returned your call. I also relayed your message to her as well. Thanks

## 2017-09-20 NOTE — Telephone Encounter (Signed)
Pt's mother called and wanted to know if Dr Allena KatzPatel can fill out a form for a discount of lyraca

## 2017-09-20 NOTE — Telephone Encounter (Signed)
Left message for patient's mom to call me back or drop off the form for me to take a look at it.
